# Patient Record
Sex: Female | Born: 1945
Health system: Southern US, Community
[De-identification: ages and names within clinical notes are randomized; demographics above are authoritative.]

## PROBLEM LIST (undated history)

## (undated) DIAGNOSIS — H6690 Otitis media, unspecified, unspecified ear: Secondary | ICD-10-CM

## (undated) DIAGNOSIS — K649 Unspecified hemorrhoids: Secondary | ICD-10-CM

## (undated) DIAGNOSIS — L0591 Pilonidal cyst without abscess: Secondary | ICD-10-CM

## (undated) DIAGNOSIS — N3020 Other chronic cystitis without hematuria: Secondary | ICD-10-CM

## (undated) DIAGNOSIS — I10 Essential (primary) hypertension: Secondary | ICD-10-CM

## (undated) HISTORY — DX: Unspecified hemorrhoids: K64.9

## (undated) HISTORY — DX: Otitis media, unspecified, unspecified ear: H66.90

## (undated) HISTORY — DX: Other chronic cystitis without hematuria: N30.20

## (undated) HISTORY — DX: Pilonidal cyst without abscess: L05.91

---

## 1980-08-13 HISTORY — PX: TUBAL LIGATION: SHX77

## 1998-12-30 ENCOUNTER — Other Ambulatory Visit: Admission: RE | Admit: 1998-12-30 | Discharge: 1998-12-30 | Payer: Self-pay | Admitting: Obstetrics and Gynecology

## 2001-04-03 ENCOUNTER — Other Ambulatory Visit: Admission: RE | Admit: 2001-04-03 | Discharge: 2001-04-03 | Payer: Self-pay | Admitting: Obstetrics and Gynecology

## 2001-11-28 ENCOUNTER — Emergency Department (HOSPITAL_COMMUNITY): Admission: EM | Admit: 2001-11-28 | Discharge: 2001-11-28 | Payer: Self-pay | Admitting: Emergency Medicine

## 2001-12-10 ENCOUNTER — Other Ambulatory Visit: Admission: RE | Admit: 2001-12-10 | Discharge: 2001-12-10 | Payer: Self-pay | Admitting: Obstetrics and Gynecology

## 2004-09-12 ENCOUNTER — Other Ambulatory Visit: Admission: RE | Admit: 2004-09-12 | Discharge: 2004-09-12 | Payer: Self-pay | Admitting: Obstetrics and Gynecology

## 2005-07-26 ENCOUNTER — Encounter: Admission: RE | Admit: 2005-07-26 | Discharge: 2005-07-26 | Payer: Self-pay | Admitting: Internal Medicine

## 2005-08-13 HISTORY — PX: COLONOSCOPY W/ BIOPSIES AND POLYPECTOMY: SHX1376

## 2006-05-07 ENCOUNTER — Ambulatory Visit: Payer: Self-pay | Admitting: Gastroenterology

## 2006-05-15 ENCOUNTER — Ambulatory Visit: Payer: Self-pay | Admitting: Gastroenterology

## 2011-02-05 ENCOUNTER — Other Ambulatory Visit: Payer: Self-pay | Admitting: Obstetrics and Gynecology

## 2011-02-05 DIAGNOSIS — R928 Other abnormal and inconclusive findings on diagnostic imaging of breast: Secondary | ICD-10-CM

## 2011-02-15 ENCOUNTER — Ambulatory Visit
Admission: RE | Admit: 2011-02-15 | Discharge: 2011-02-15 | Disposition: A | Discharge status: Home / Self Care | Payer: Medicare Other | Source: Ambulatory Visit | Attending: Obstetrics and Gynecology | Admitting: Obstetrics and Gynecology

## 2011-02-15 DIAGNOSIS — R928 Other abnormal and inconclusive findings on diagnostic imaging of breast: Secondary | ICD-10-CM

## 2011-02-15 IMAGING — MG MM DIGITAL DIAGNOSTIC UNILAT L {BCG}
2 series · 2 of 2 positions shown · non-contrast
Comparison: Prior exams, [REDACTED], dating back to
[DATE]

CLINICAL DATA: Screening callback for questioned left breast
calcifications in the lower inner quadrant

DIGITAL DIAGNOSTIC LEFT MAMMOGRAM WITHOUT CAD

[L CC]
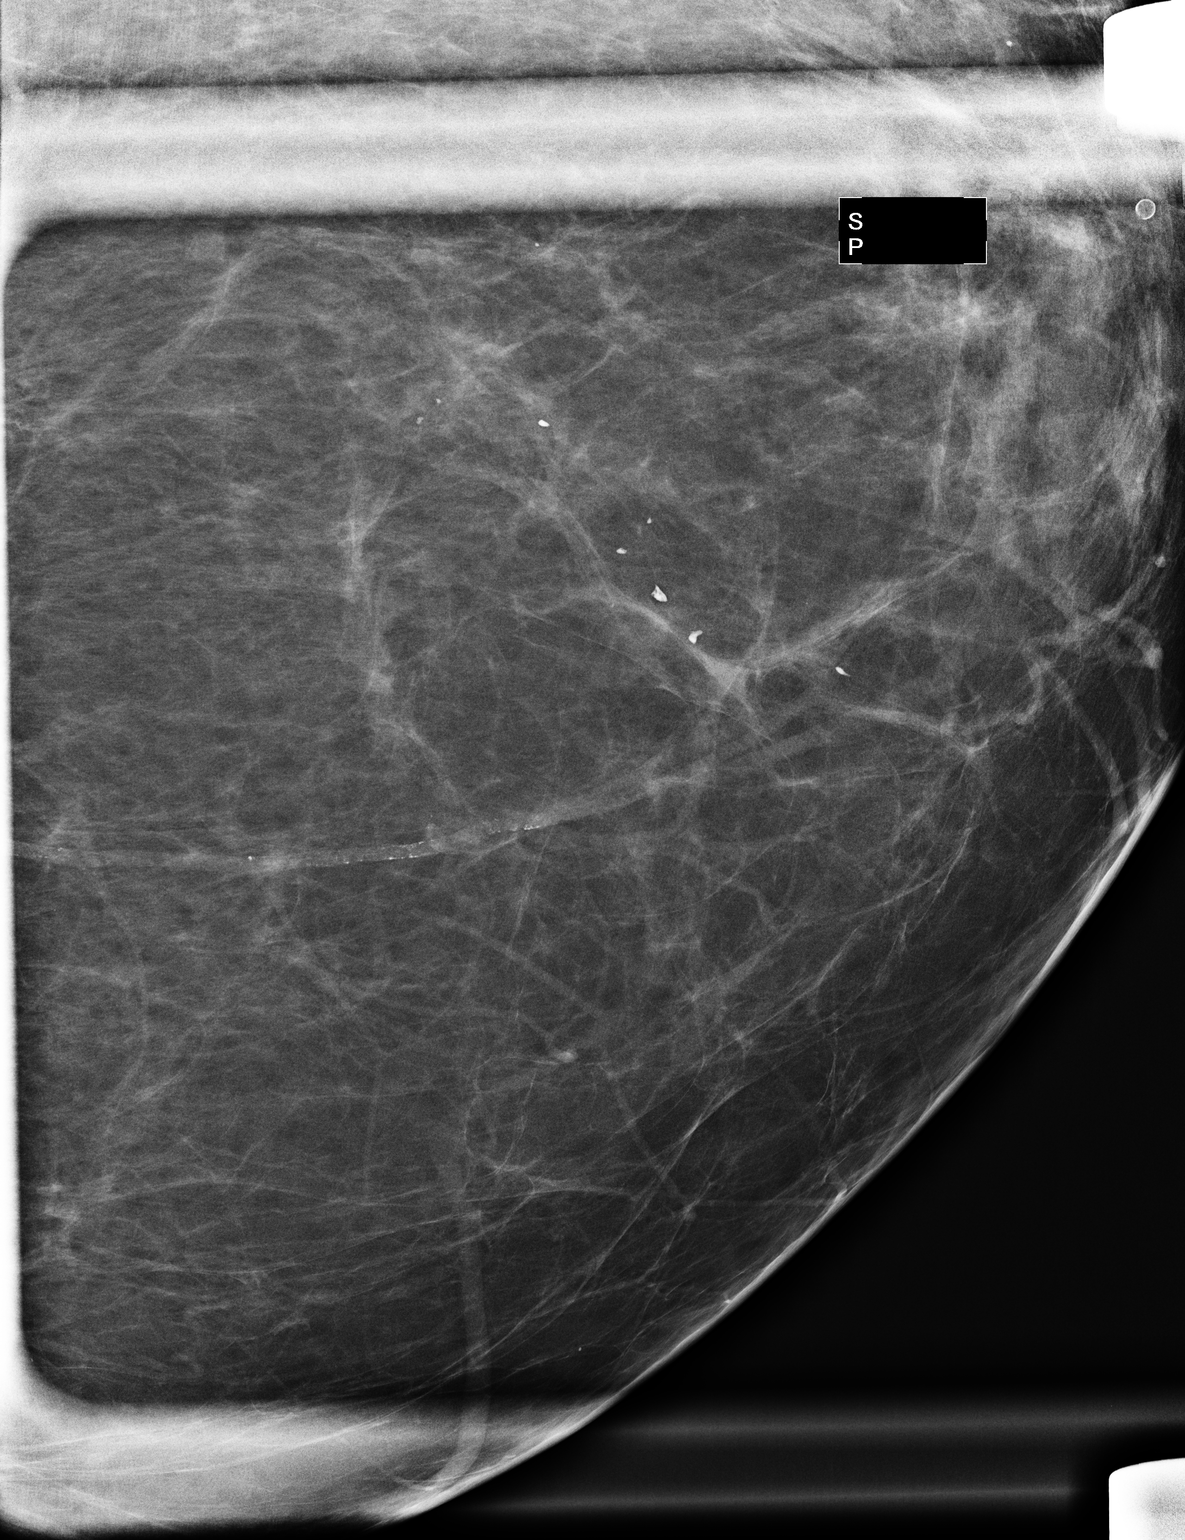

[L ML]
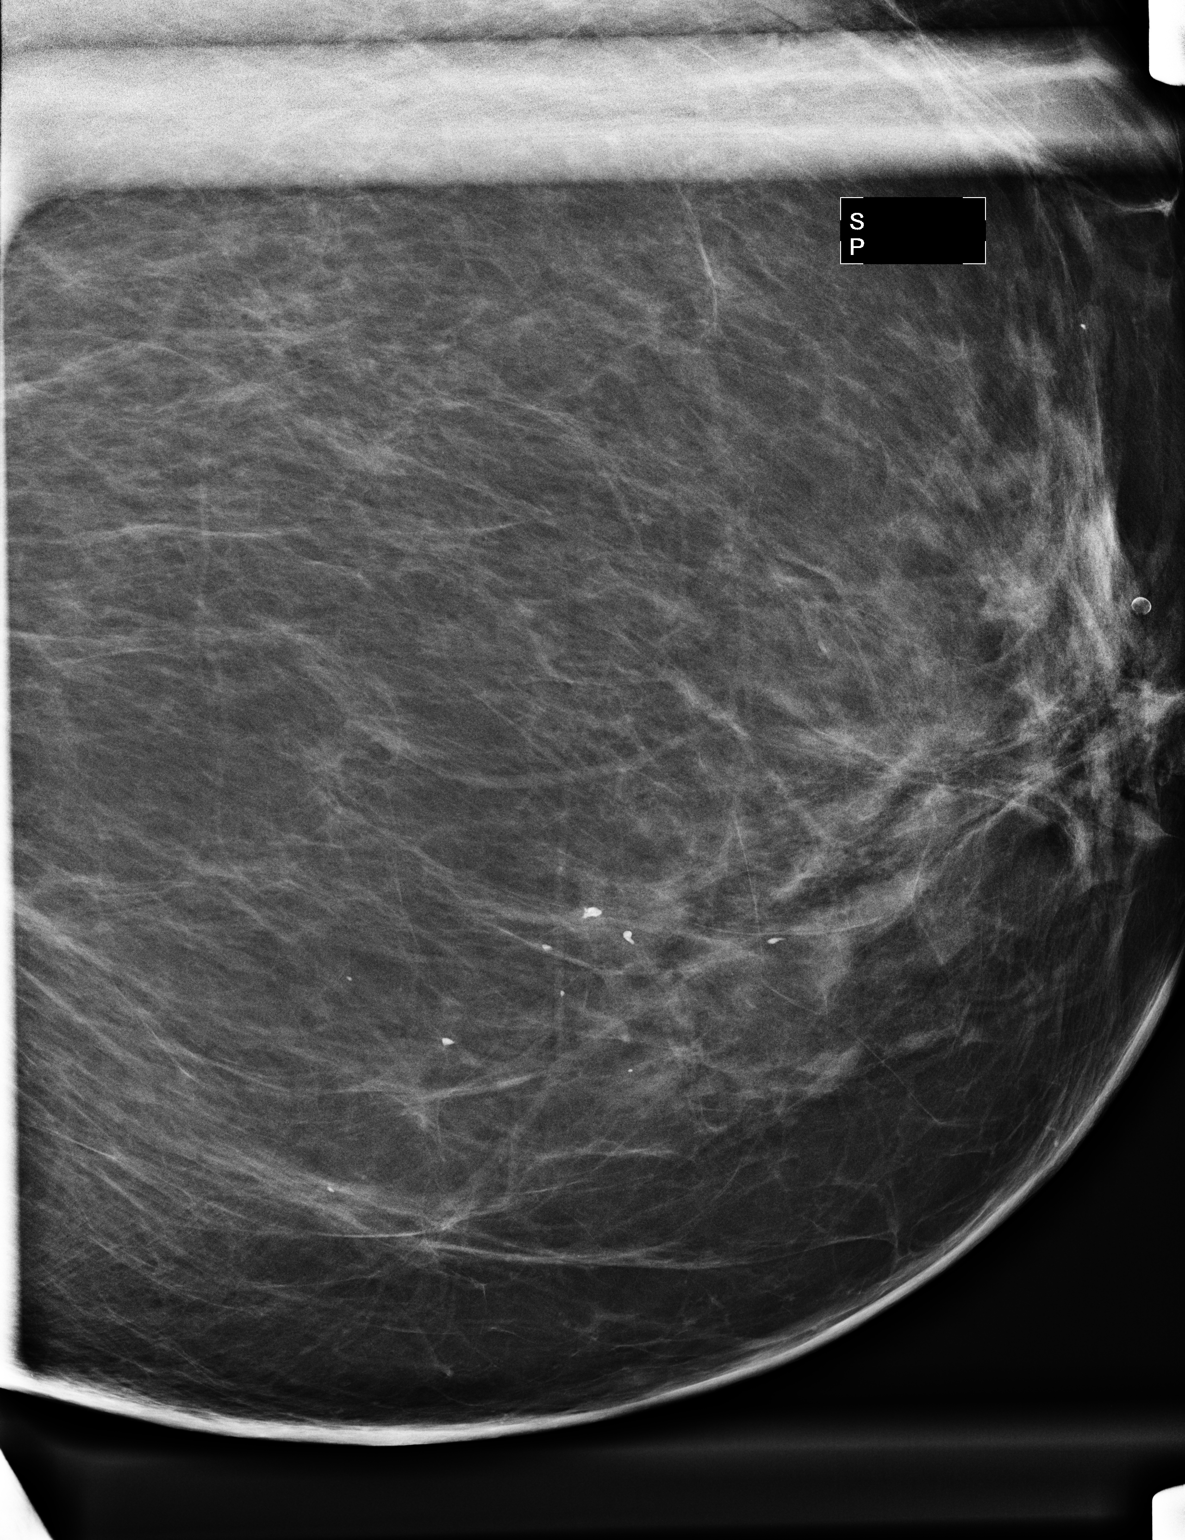

[2 of 2 positions shown; findings below may reference images not displayed]

FINDINGS: Benign appearing coarse calcifications are loosely
grouped in the left lower inner quadrant, without malignant-type
morphology or distribution.
IMPRESSION: Benign appearing calcifications, left lower inner quadrant.  No
evidence for malignancy. Screening mammography is recommended in
one year. Findings and recommendations discussed with the patient
and provided in written form at the time of the exam.

BI-RADS CATEGORY 1:  Negative.

## 2011-06-14 ENCOUNTER — Encounter: Payer: Self-pay | Admitting: Gastroenterology

## 2011-10-10 DIAGNOSIS — J019 Acute sinusitis, unspecified: Secondary | ICD-10-CM | POA: Diagnosis not present

## 2011-10-10 DIAGNOSIS — J209 Acute bronchitis, unspecified: Secondary | ICD-10-CM | POA: Diagnosis not present

## 2011-10-10 DIAGNOSIS — H6120 Impacted cerumen, unspecified ear: Secondary | ICD-10-CM | POA: Diagnosis not present

## 2011-11-23 DIAGNOSIS — R82998 Other abnormal findings in urine: Secondary | ICD-10-CM | POA: Diagnosis not present

## 2011-11-23 DIAGNOSIS — F329 Major depressive disorder, single episode, unspecified: Secondary | ICD-10-CM | POA: Diagnosis not present

## 2011-11-23 DIAGNOSIS — E669 Obesity, unspecified: Secondary | ICD-10-CM | POA: Diagnosis not present

## 2011-11-23 DIAGNOSIS — E785 Hyperlipidemia, unspecified: Secondary | ICD-10-CM | POA: Diagnosis not present

## 2011-11-30 DIAGNOSIS — F329 Major depressive disorder, single episode, unspecified: Secondary | ICD-10-CM | POA: Diagnosis not present

## 2011-11-30 DIAGNOSIS — E669 Obesity, unspecified: Secondary | ICD-10-CM | POA: Diagnosis not present

## 2011-11-30 DIAGNOSIS — E785 Hyperlipidemia, unspecified: Secondary | ICD-10-CM | POA: Diagnosis not present

## 2011-11-30 DIAGNOSIS — Z0000 Encounter for general adult medical examination without abnormal findings: Secondary | ICD-10-CM | POA: Diagnosis not present

## 2011-12-10 ENCOUNTER — Encounter: Payer: Self-pay | Admitting: Gastroenterology

## 2012-01-15 ENCOUNTER — Encounter: Payer: Self-pay | Admitting: Gastroenterology

## 2012-01-21 ENCOUNTER — Ambulatory Visit (AMBULATORY_SURGERY_CENTER): Payer: Medicare Other | Admitting: *Deleted

## 2012-01-21 VITALS — Ht 62.5 in | Wt 197.5 lb

## 2012-01-21 DIAGNOSIS — Z1211 Encounter for screening for malignant neoplasm of colon: Secondary | ICD-10-CM

## 2012-01-21 MED ORDER — PEG-KCL-NACL-NASULF-NA ASC-C 100 G PO SOLR: ORAL | Status: DC

## 2012-02-04 ENCOUNTER — Ambulatory Visit (AMBULATORY_SURGERY_CENTER): Payer: Medicare Other | Admitting: Gastroenterology

## 2012-02-04 ENCOUNTER — Encounter: Payer: Self-pay | Admitting: Gastroenterology

## 2012-02-04 VITALS — BP 144/75 | HR 84 | Temp 97.7°F | Resp 20 | Ht 62.5 in | Wt 197.0 lb

## 2012-02-04 DIAGNOSIS — Z860100 Personal history of colon polyps, unspecified: Secondary | ICD-10-CM

## 2012-02-04 DIAGNOSIS — D126 Benign neoplasm of colon, unspecified: Secondary | ICD-10-CM | POA: Diagnosis not present

## 2012-02-04 DIAGNOSIS — Z1211 Encounter for screening for malignant neoplasm of colon: Secondary | ICD-10-CM

## 2012-02-04 MED ORDER — SODIUM CHLORIDE 0.9 % IV SOLN: 500.0000 mL | INTRAVENOUS | Status: DC

## 2012-02-04 NOTE — Patient Instructions (Addendum)
YOU HAD AN ENDOSCOPIC PROCEDURE TODAY AT THE [facility]: Refer to the procedure report that was given to you for any specific questions about what was found during the examination.  If the procedure report does not answer your questions, please call your gastroenterologist to clarify.  If you requested that your care partner not be given the details of your procedure findings, then the procedure report has been included in a sealed envelope for you to review at your convenience later.  YOU SHOULD EXPECT: Some feelings of bloating in the abdomen. Passage of more gas than usual.  Walking can help get rid of the air that was put into your GI tract during the procedure and reduce the bloating. If you had a lower endoscopy (such as a colonoscopy or flexible sigmoidoscopy) you may notice spotting of blood in your stool or on the toilet paper. If you underwent a bowel prep for your procedure, then you may not have a normal bowel movement for a few days.  DIET: Your first meal following the procedure should be a light meal and then it is ok to progress to your normal diet.  A half-sandwich or bowl of soup is an example of a good first meal.  Heavy or fried foods are harder to digest and may make you feel nauseous or bloated.  Likewise meals heavy in dairy and vegetables can cause extra gas to form and this can also increase the bloating.  Drink plenty of fluids but you should avoid alcoholic beverages for 24 hours.  ACTIVITY: Your care partner should take you home directly after the procedure.  You should plan to take it easy, moving slowly for the rest of the day.  You can resume normal activity the day after the procedure however you should NOT DRIVE or use heavy machinery for 24 hours (because of the sedation medicines used during the test).    SYMPTOMS TO REPORT IMMEDIATELY: A gastroenterologist can be reached at any hour.  During normal business hours, 8:30 AM to 5:00 PM Monday through Friday,  call [phone].  After hours and on weekends, please call the GI answering service at [phone] who will take a message and have the physician on call contact you.   Following lower endoscopy (colonoscopy or flexible sigmoidoscopy):  Excessive amounts of blood in the stool  Significant tenderness or worsening of abdominal pains  Swelling of the abdomen that is new, acute  Fever of 100F or higher  Following upper endoscopy (EGD)  Vomiting of blood or coffee ground material  New chest pain or pain under the shoulder blades  Painful or persistently difficult swallowing  New shortness of breath  Fever of 100F or higher  Black, tarry-looking stools  FOLLOW UP: If any biopsies were taken you will be contacted by phone or by letter within the next 1-3 weeks.  Call your gastroenterologist if you have not heard about the biopsies in 3 weeks.  Our staff will call the home number listed on your records the next business day following your procedure to check on you and address any questions or concerns that you may have at that time regarding the information given to you following your procedure. This is a courtesy call and so if there is no answer at the home number and we have not heard from you through the emergency physician on call, we will assume that you have returned to your regular daily activities without incident.  SIGNATURES/CONFIDENTIALITY: You and/or your care   partner have signed paperwork which will be entered into your electronic medical record.  These signatures attest to the fact that that the information above on your After Visit Summary has been reviewed and is understood.  Full responsibility of the confidentiality of this discharge information lies with you and/or your care-partner.  

## 2012-02-04 NOTE — Progress Notes (Signed)
Propofol given per K Rogers CRNA 

## 2012-02-04 NOTE — Progress Notes (Signed)
Patient did not experience any of the following events: a burn prior to discharge; a fall within the facility; wrong site/side/patient/procedure/implant event; or a hospital transfer or hospital admission upon discharge from the facility. (G8907) Patient did not have preoperative order for IV antibiotic SSI prophylaxis. (G8918)  

## 2012-02-04 NOTE — Op Note (Signed)
[facility] [address] [place], [place]  [address]  COLONOSCOPY PROCEDURE REPORT  PATIENT:  Olivia Velazquez, Olivia Velazquez  MR#:  [id number] BIRTHDATE:  03-07-1946, 65 yrs. old  GENDER:  female ENDOSCOPIST:  Barbette Hair. Arlyce Dice, MD REF. BY: PROCEDURE DATE:  02/04/2012 PROCEDURE:  Colonoscopy with snare polypectomy, Colon with cold biopsy polypectomy ASA CLASS:  Class I INDICATIONS:  Screening, history of pre-cancerous (adenomatous) colon polyps Index polypectomy 2007 MEDICATIONS:   MAC sedation, administered by CRNA propofol 200mg IV  DESCRIPTION OF PROCEDURE:   After the risks benefits and alternatives of the procedure were thoroughly explained, informed consent was obtained.  Digital rectal exam was performed and revealed no abnormalities.   The LB [id number]-[id number] endoscope was introduced through the anus and advanced to the cecum, which was identified by both the appendix and ileocecal valve, without limitations.  The quality of the prep was good, using MoviPrep. The instrument was then slowly withdrawn as the colon was fully examined. <<PROCEDUREIMAGES>>  FINDINGS:  A sessile polyp was found in the mid transverse colon. It was 5 mm in size. Polyp was snared without cautery. Retrieval was successful (see image3). snare polyp  A diminutive polyp was found in the distal transverse colon. It was 2 mm in size. The polyp was removed using cold biopsy forceps (see image4).  A lipoma was found in the proximal transverse colon (see image2). This was otherwise a normal examination of the colon (see image1 and image5).   Retroflexed views in the rectum revealed no abnormalities.    The time to cecum =  1) 4.0  minutes. The scope was then withdrawn in  1) 9.0  minutes from the cecum and the procedure completed. COMPLICATIONS:  None ENDOSCOPIC IMPRESSION: 1) 5 mm sessile polyp in the mid transverse colon 2) 2 mm diminutive polyp in the distal transverse colon 3) Lipoma in the proximal  transverse colon 4) Otherwise normal examination RECOMMENDATIONS: 1) If the polyp(s) removed today are proven to be adenomatous (pre-cancerous) polyps, you will need a repeat colonoscopy in 5 years. Otherwise you should continue to follow colorectal cancer screening guidelines for "routine risk" patients with colonoscopy in 10 years. You will receive a letter within 1-2 weeks with the results of your biopsy as well as final recommendations. Please call my office if you have not received a letter after 3 weeks. REPEAT EXAM:  You will receive a letter from Dr. Arlyce Dice in 1-2 weeks, after reviewing the final pathology, with followup recommendations.  ______________________________ Barbette Hair Arlyce Dice, MD  CC:  Kari Baars, MD  n. eSIGNED:   Barbette Hair. Ryler Laskowski at 02/04/2012 09:46 AM  Elgie Collard, [id number]

## 2012-02-05 ENCOUNTER — Telehealth: Payer: Self-pay

## 2012-02-05 NOTE — Telephone Encounter (Signed)
  Follow up Call-  Call back number 02/04/2012  Post procedure Call Back phone  # [phone]  Permission to leave phone message Yes     Patient questions:  Do you have a fever, pain , or abdominal swelling? no Pain Score  0 *  Have you tolerated food without any problems? yes  Have you been able to return to your normal activities? yes  Do you have any questions about your discharge instructions: Diet   no Medications  no Follow up visit  no  Do you have questions or concerns about your Care? no  Actions: * If pain score is 4 or above: No action needed, pain <4.

## 2012-02-07 ENCOUNTER — Encounter: Payer: Self-pay | Admitting: Gastroenterology

## 2012-04-29 DIAGNOSIS — Z23 Encounter for immunization: Secondary | ICD-10-CM | POA: Diagnosis not present

## 2012-07-04 DIAGNOSIS — Z23 Encounter for immunization: Secondary | ICD-10-CM | POA: Diagnosis not present

## 2012-07-07 DIAGNOSIS — Z83511 Family history of glaucoma: Secondary | ICD-10-CM | POA: Diagnosis not present

## 2012-07-07 DIAGNOSIS — H5200 Hypermetropia, unspecified eye: Secondary | ICD-10-CM | POA: Diagnosis not present

## 2012-07-07 DIAGNOSIS — H2510 Age-related nuclear cataract, unspecified eye: Secondary | ICD-10-CM | POA: Diagnosis not present

## 2012-07-07 DIAGNOSIS — D3130 Benign neoplasm of unspecified choroid: Secondary | ICD-10-CM | POA: Diagnosis not present

## 2012-11-26 DIAGNOSIS — E785 Hyperlipidemia, unspecified: Secondary | ICD-10-CM | POA: Diagnosis not present

## 2012-12-03 DIAGNOSIS — Z0000 Encounter for general adult medical examination without abnormal findings: Secondary | ICD-10-CM | POA: Diagnosis not present

## 2012-12-03 DIAGNOSIS — K6289 Other specified diseases of anus and rectum: Secondary | ICD-10-CM | POA: Diagnosis not present

## 2012-12-03 DIAGNOSIS — E669 Obesity, unspecified: Secondary | ICD-10-CM | POA: Diagnosis not present

## 2012-12-03 DIAGNOSIS — E785 Hyperlipidemia, unspecified: Secondary | ICD-10-CM | POA: Diagnosis not present

## 2012-12-03 DIAGNOSIS — F329 Major depressive disorder, single episode, unspecified: Secondary | ICD-10-CM | POA: Diagnosis not present

## 2012-12-03 DIAGNOSIS — Z6835 Body mass index (BMI) 35.0-35.9, adult: Secondary | ICD-10-CM | POA: Diagnosis not present

## 2012-12-03 DIAGNOSIS — Z1331 Encounter for screening for depression: Secondary | ICD-10-CM | POA: Diagnosis not present

## 2012-12-03 DIAGNOSIS — J45909 Unspecified asthma, uncomplicated: Secondary | ICD-10-CM | POA: Diagnosis not present

## 2012-12-03 DIAGNOSIS — N390 Urinary tract infection, site not specified: Secondary | ICD-10-CM | POA: Diagnosis not present

## 2013-04-30 ENCOUNTER — Ambulatory Visit (INDEPENDENT_AMBULATORY_CARE_PROVIDER_SITE_OTHER): Payer: Self-pay | Admitting: Gastroenterology

## 2013-04-30 ENCOUNTER — Encounter: Payer: Self-pay | Admitting: Gastroenterology

## 2013-04-30 VITALS — BP 120/78 | HR 80 | Ht 62.5 in | Wt 196.5 lb

## 2013-04-30 DIAGNOSIS — K649 Unspecified hemorrhoids: Secondary | ICD-10-CM

## 2013-04-30 MED ORDER — HYDROCORTISONE 2.5 % RE CREA: TOPICAL_CREAM | Freq: Two times a day (BID) | RECTAL | Status: DC

## 2013-04-30 NOTE — Assessment & Plan Note (Signed)
Intermittent rectal discomfort is undoubtedly related to hemorrhoids.  She has responded well to topical ointments in the past.  Constipation is probably a contributing factor.  Recommendations #1 renew Procto cream #2 continue stool softeners #3 to consider band ligation of internal hemorrhoids if symptoms are persistent

## 2013-04-30 NOTE — Progress Notes (Signed)
History of Present Illness: 67 year old white female with history of colon polyps here for evaluation of rectal discomfort.  Intermittently she has rectal pain for which she takes a topical cream with relief.  She notices "bumps" She suffers from chronic constipation which recently has improved with the addition of stool softeners.    Past Medical History  Diagnosis Date  . Bladder infection, chronic    Past Surgical History  Procedure Laterality Date  . Tubal ligation  1982  . Colonoscopy w/ biopsies and polypectomy  2007    adenomatous polps/ Dr. Arlyce Dice   family history includes Colon cancer (age of onset: 70) in her paternal uncle. There is no history of Stomach cancer. Current Outpatient Prescriptions  Medication Sig Dispense Refill  . hydrocortisone (PROCTOZONE-HC) 2.5 % rectal cream Place rectally 2 (two) times daily.      Marland Kitchen OVER THE COUNTER MEDICATION [employer] brand stool softener      . nitrofurantoin (MACRODANTIN) 100 MG capsule Take 100 mg by mouth daily.       No current facility-administered medications for this visit.   Allergies as of 04/30/2013  . (No Known Allergies)    reports that she has never smoked. She has never used smokeless tobacco. She reports that she does not drink alcohol or use illicit drugs.     Review of Systems: Pertinent positive and negative review of systems were noted in the above HPI section. All other review of systems were otherwise negative.  Vital signs were reviewed in today's medical record Physical Exam: General: Well developed , well nourished, no acute distress Skin: anicteric Head: Normocephalic and atraumatic Eyes:  sclerae anicteric, EOMI Ears: Normal auditory acuity Mouth: No deformity or lesions Neck: Supple, no masses or thyromegaly Lungs: Clear throughout to auscultation Heart: Regular rate and rhythm; no murmurs, rubs or bruits Abdomen: Soft, non tender and non distended. No masses, hepatosplenomegaly or hernias noted.  Normal Bowel sounds Rectal: External skin tags are present Musculoskeletal: Symmetrical with no gross deformities  Skin: No lesions on visible extremities Pulses:  Normal pulses noted Extremities: No clubbing, cyanosis, edema or deformities noted Neurological: Alert oriented x 4, grossly nonfocal Cervical Nodes:  No significant cervical adenopathy Inguinal Nodes: No significant inguinal adenopathy Psychological:  Alert and cooperative. Normal mood and affect

## 2013-04-30 NOTE — Patient Instructions (Addendum)
Your prescription will be sent to your pharmacy 

## 2013-05-08 DIAGNOSIS — Z23 Encounter for immunization: Secondary | ICD-10-CM | POA: Diagnosis not present

## 2013-07-14 DIAGNOSIS — H2510 Age-related nuclear cataract, unspecified eye: Secondary | ICD-10-CM | POA: Diagnosis not present

## 2013-07-14 DIAGNOSIS — D3130 Benign neoplasm of unspecified choroid: Secondary | ICD-10-CM | POA: Diagnosis not present

## 2013-12-01 DIAGNOSIS — R82998 Other abnormal findings in urine: Secondary | ICD-10-CM | POA: Diagnosis not present

## 2013-12-01 DIAGNOSIS — E785 Hyperlipidemia, unspecified: Secondary | ICD-10-CM | POA: Diagnosis not present

## 2013-12-08 DIAGNOSIS — E785 Hyperlipidemia, unspecified: Secondary | ICD-10-CM | POA: Diagnosis not present

## 2013-12-08 DIAGNOSIS — Z0000 Encounter for general adult medical examination without abnormal findings: Secondary | ICD-10-CM | POA: Diagnosis not present

## 2013-12-08 DIAGNOSIS — F329 Major depressive disorder, single episode, unspecified: Secondary | ICD-10-CM | POA: Diagnosis not present

## 2013-12-08 DIAGNOSIS — K6289 Other specified diseases of anus and rectum: Secondary | ICD-10-CM | POA: Diagnosis not present

## 2013-12-08 DIAGNOSIS — E669 Obesity, unspecified: Secondary | ICD-10-CM | POA: Diagnosis not present

## 2013-12-08 DIAGNOSIS — Z23 Encounter for immunization: Secondary | ICD-10-CM | POA: Diagnosis not present

## 2013-12-08 DIAGNOSIS — F3289 Other specified depressive episodes: Secondary | ICD-10-CM | POA: Diagnosis not present

## 2013-12-08 DIAGNOSIS — N390 Urinary tract infection, site not specified: Secondary | ICD-10-CM | POA: Diagnosis not present

## 2013-12-08 DIAGNOSIS — J45909 Unspecified asthma, uncomplicated: Secondary | ICD-10-CM | POA: Diagnosis not present

## 2014-03-18 ENCOUNTER — Other Ambulatory Visit: Payer: Self-pay | Admitting: Gastroenterology

## 2014-05-07 DIAGNOSIS — Z23 Encounter for immunization: Secondary | ICD-10-CM | POA: Diagnosis not present

## 2014-06-07 ENCOUNTER — Other Ambulatory Visit: Payer: Self-pay | Admitting: Gastroenterology

## 2014-07-20 DIAGNOSIS — H52223 Regular astigmatism, bilateral: Secondary | ICD-10-CM | POA: Diagnosis not present

## 2014-07-20 DIAGNOSIS — H2513 Age-related nuclear cataract, bilateral: Secondary | ICD-10-CM | POA: Diagnosis not present

## 2014-07-20 DIAGNOSIS — D3132 Benign neoplasm of left choroid: Secondary | ICD-10-CM | POA: Diagnosis not present

## 2014-07-20 DIAGNOSIS — H5203 Hypermetropia, bilateral: Secondary | ICD-10-CM | POA: Diagnosis not present

## 2014-07-22 DIAGNOSIS — H6122 Impacted cerumen, left ear: Secondary | ICD-10-CM | POA: Diagnosis not present

## 2014-07-22 DIAGNOSIS — H6121 Impacted cerumen, right ear: Secondary | ICD-10-CM | POA: Diagnosis not present

## 2014-10-07 DIAGNOSIS — J069 Acute upper respiratory infection, unspecified: Secondary | ICD-10-CM | POA: Diagnosis not present

## 2014-10-07 DIAGNOSIS — R0602 Shortness of breath: Secondary | ICD-10-CM | POA: Diagnosis not present

## 2014-10-07 DIAGNOSIS — Z6838 Body mass index (BMI) 38.0-38.9, adult: Secondary | ICD-10-CM | POA: Diagnosis not present

## 2014-10-07 DIAGNOSIS — J45909 Unspecified asthma, uncomplicated: Secondary | ICD-10-CM | POA: Diagnosis not present

## 2014-12-09 DIAGNOSIS — E785 Hyperlipidemia, unspecified: Secondary | ICD-10-CM | POA: Diagnosis not present

## 2014-12-09 DIAGNOSIS — Z0000 Encounter for general adult medical examination without abnormal findings: Secondary | ICD-10-CM | POA: Diagnosis not present

## 2014-12-16 DIAGNOSIS — F329 Major depressive disorder, single episode, unspecified: Secondary | ICD-10-CM | POA: Diagnosis not present

## 2014-12-16 DIAGNOSIS — Z0000 Encounter for general adult medical examination without abnormal findings: Secondary | ICD-10-CM | POA: Diagnosis not present

## 2014-12-16 DIAGNOSIS — E785 Hyperlipidemia, unspecified: Secondary | ICD-10-CM | POA: Diagnosis not present

## 2014-12-16 DIAGNOSIS — R312 Other microscopic hematuria: Secondary | ICD-10-CM | POA: Diagnosis not present

## 2014-12-16 DIAGNOSIS — Z6838 Body mass index (BMI) 38.0-38.9, adult: Secondary | ICD-10-CM | POA: Diagnosis not present

## 2014-12-16 DIAGNOSIS — J45909 Unspecified asthma, uncomplicated: Secondary | ICD-10-CM | POA: Diagnosis not present

## 2014-12-16 DIAGNOSIS — E669 Obesity, unspecified: Secondary | ICD-10-CM | POA: Diagnosis not present

## 2014-12-16 DIAGNOSIS — N390 Urinary tract infection, site not specified: Secondary | ICD-10-CM | POA: Diagnosis not present

## 2014-12-16 DIAGNOSIS — Z1389 Encounter for screening for other disorder: Secondary | ICD-10-CM | POA: Diagnosis not present

## 2014-12-22 DIAGNOSIS — Z780 Asymptomatic menopausal state: Secondary | ICD-10-CM | POA: Diagnosis not present

## 2015-06-01 DIAGNOSIS — Z23 Encounter for immunization: Secondary | ICD-10-CM | POA: Diagnosis not present

## 2015-07-26 DIAGNOSIS — H5203 Hypermetropia, bilateral: Secondary | ICD-10-CM | POA: Diagnosis not present

## 2015-07-26 DIAGNOSIS — H2513 Age-related nuclear cataract, bilateral: Secondary | ICD-10-CM | POA: Diagnosis not present

## 2015-07-26 DIAGNOSIS — H25013 Cortical age-related cataract, bilateral: Secondary | ICD-10-CM | POA: Diagnosis not present

## 2015-07-26 DIAGNOSIS — D3132 Benign neoplasm of left choroid: Secondary | ICD-10-CM | POA: Diagnosis not present

## 2015-09-05 DIAGNOSIS — N390 Urinary tract infection, site not specified: Secondary | ICD-10-CM | POA: Diagnosis not present

## 2015-09-14 DIAGNOSIS — R7301 Impaired fasting glucose: Secondary | ICD-10-CM | POA: Diagnosis not present

## 2015-09-14 DIAGNOSIS — Z6838 Body mass index (BMI) 38.0-38.9, adult: Secondary | ICD-10-CM | POA: Diagnosis not present

## 2015-09-14 DIAGNOSIS — Z1389 Encounter for screening for other disorder: Secondary | ICD-10-CM | POA: Diagnosis not present

## 2015-09-14 DIAGNOSIS — N390 Urinary tract infection, site not specified: Secondary | ICD-10-CM | POA: Diagnosis not present

## 2015-12-14 DIAGNOSIS — Z79899 Other long term (current) drug therapy: Secondary | ICD-10-CM | POA: Diagnosis not present

## 2015-12-14 DIAGNOSIS — E784 Other hyperlipidemia: Secondary | ICD-10-CM | POA: Diagnosis not present

## 2015-12-14 DIAGNOSIS — M859 Disorder of bone density and structure, unspecified: Secondary | ICD-10-CM | POA: Diagnosis not present

## 2015-12-14 DIAGNOSIS — R7301 Impaired fasting glucose: Secondary | ICD-10-CM | POA: Diagnosis not present

## 2015-12-21 DIAGNOSIS — F329 Major depressive disorder, single episode, unspecified: Secondary | ICD-10-CM | POA: Diagnosis not present

## 2015-12-21 DIAGNOSIS — M859 Disorder of bone density and structure, unspecified: Secondary | ICD-10-CM | POA: Diagnosis not present

## 2015-12-21 DIAGNOSIS — E784 Other hyperlipidemia: Secondary | ICD-10-CM | POA: Diagnosis not present

## 2015-12-21 DIAGNOSIS — Z6836 Body mass index (BMI) 36.0-36.9, adult: Secondary | ICD-10-CM | POA: Diagnosis not present

## 2015-12-21 DIAGNOSIS — E668 Other obesity: Secondary | ICD-10-CM | POA: Diagnosis not present

## 2015-12-21 DIAGNOSIS — J45998 Other asthma: Secondary | ICD-10-CM | POA: Diagnosis not present

## 2015-12-21 DIAGNOSIS — Z0000 Encounter for general adult medical examination without abnormal findings: Secondary | ICD-10-CM | POA: Diagnosis not present

## 2015-12-21 DIAGNOSIS — E559 Vitamin D deficiency, unspecified: Secondary | ICD-10-CM | POA: Diagnosis not present

## 2015-12-21 DIAGNOSIS — R7301 Impaired fasting glucose: Secondary | ICD-10-CM | POA: Diagnosis not present

## 2015-12-21 DIAGNOSIS — Z1389 Encounter for screening for other disorder: Secondary | ICD-10-CM | POA: Diagnosis not present

## 2015-12-21 DIAGNOSIS — N390 Urinary tract infection, site not specified: Secondary | ICD-10-CM | POA: Diagnosis not present

## 2016-05-26 DIAGNOSIS — Z23 Encounter for immunization: Secondary | ICD-10-CM | POA: Diagnosis not present

## 2016-07-27 DIAGNOSIS — D3132 Benign neoplasm of left choroid: Secondary | ICD-10-CM | POA: Diagnosis not present

## 2016-07-27 DIAGNOSIS — Z83511 Family history of glaucoma: Secondary | ICD-10-CM | POA: Diagnosis not present

## 2016-07-27 DIAGNOSIS — H25013 Cortical age-related cataract, bilateral: Secondary | ICD-10-CM | POA: Diagnosis not present

## 2016-07-27 DIAGNOSIS — H524 Presbyopia: Secondary | ICD-10-CM | POA: Diagnosis not present

## 2016-07-27 DIAGNOSIS — H5203 Hypermetropia, bilateral: Secondary | ICD-10-CM | POA: Diagnosis not present

## 2016-07-27 DIAGNOSIS — H52203 Unspecified astigmatism, bilateral: Secondary | ICD-10-CM | POA: Diagnosis not present

## 2016-07-27 DIAGNOSIS — H2513 Age-related nuclear cataract, bilateral: Secondary | ICD-10-CM | POA: Diagnosis not present

## 2016-11-27 ENCOUNTER — Encounter: Payer: Self-pay | Admitting: Gastroenterology

## 2016-12-14 DIAGNOSIS — E784 Other hyperlipidemia: Secondary | ICD-10-CM | POA: Diagnosis not present

## 2016-12-14 DIAGNOSIS — M859 Disorder of bone density and structure, unspecified: Secondary | ICD-10-CM | POA: Diagnosis not present

## 2016-12-14 DIAGNOSIS — R7301 Impaired fasting glucose: Secondary | ICD-10-CM | POA: Diagnosis not present

## 2016-12-21 DIAGNOSIS — N390 Urinary tract infection, site not specified: Secondary | ICD-10-CM | POA: Diagnosis not present

## 2016-12-21 DIAGNOSIS — J45909 Unspecified asthma, uncomplicated: Secondary | ICD-10-CM | POA: Diagnosis not present

## 2016-12-21 DIAGNOSIS — Z0000 Encounter for general adult medical examination without abnormal findings: Secondary | ICD-10-CM | POA: Diagnosis not present

## 2016-12-21 DIAGNOSIS — Z6835 Body mass index (BMI) 35.0-35.9, adult: Secondary | ICD-10-CM | POA: Diagnosis not present

## 2016-12-21 DIAGNOSIS — E668 Other obesity: Secondary | ICD-10-CM | POA: Diagnosis not present

## 2016-12-21 DIAGNOSIS — R7301 Impaired fasting glucose: Secondary | ICD-10-CM | POA: Diagnosis not present

## 2016-12-21 DIAGNOSIS — E559 Vitamin D deficiency, unspecified: Secondary | ICD-10-CM | POA: Diagnosis not present

## 2016-12-21 DIAGNOSIS — M859 Disorder of bone density and structure, unspecified: Secondary | ICD-10-CM | POA: Diagnosis not present

## 2016-12-21 DIAGNOSIS — F3289 Other specified depressive episodes: Secondary | ICD-10-CM | POA: Diagnosis not present

## 2016-12-21 DIAGNOSIS — Z1389 Encounter for screening for other disorder: Secondary | ICD-10-CM | POA: Diagnosis not present

## 2016-12-21 DIAGNOSIS — E784 Other hyperlipidemia: Secondary | ICD-10-CM | POA: Diagnosis not present

## 2017-05-11 DIAGNOSIS — Z23 Encounter for immunization: Secondary | ICD-10-CM | POA: Diagnosis not present

## 2017-05-16 ENCOUNTER — Encounter: Payer: Self-pay | Admitting: Gastroenterology

## 2017-06-17 DIAGNOSIS — R8290 Unspecified abnormal findings in urine: Secondary | ICD-10-CM | POA: Diagnosis not present

## 2017-06-17 DIAGNOSIS — N390 Urinary tract infection, site not specified: Secondary | ICD-10-CM | POA: Diagnosis not present

## 2017-06-25 DIAGNOSIS — H6063 Unspecified chronic otitis externa, bilateral: Secondary | ICD-10-CM | POA: Diagnosis not present

## 2017-06-25 DIAGNOSIS — H6122 Impacted cerumen, left ear: Secondary | ICD-10-CM | POA: Diagnosis not present

## 2017-06-27 ENCOUNTER — Ambulatory Visit (AMBULATORY_SURGERY_CENTER): Payer: Self-pay

## 2017-06-27 VITALS — Ht 62.2 in | Wt 200.0 lb

## 2017-06-27 DIAGNOSIS — R8290 Unspecified abnormal findings in urine: Secondary | ICD-10-CM | POA: Diagnosis not present

## 2017-06-27 DIAGNOSIS — R3129 Other microscopic hematuria: Secondary | ICD-10-CM | POA: Diagnosis not present

## 2017-06-27 DIAGNOSIS — R82998 Other abnormal findings in urine: Secondary | ICD-10-CM | POA: Diagnosis not present

## 2017-06-27 DIAGNOSIS — Z860100 Personal history of colon polyps, unspecified: Secondary | ICD-10-CM

## 2017-06-27 DIAGNOSIS — B373 Candidiasis of vulva and vagina: Secondary | ICD-10-CM | POA: Diagnosis not present

## 2017-06-27 DIAGNOSIS — Z6835 Body mass index (BMI) 35.0-35.9, adult: Secondary | ICD-10-CM | POA: Diagnosis not present

## 2017-06-27 DIAGNOSIS — N390 Urinary tract infection, site not specified: Secondary | ICD-10-CM | POA: Diagnosis not present

## 2017-06-27 MED ORDER — SUPREP BOWEL PREP KIT 17.5-3.13-1.6 GM/177ML PO SOLN: 1.0000 | Freq: Once | ORAL | 0 refills | Status: AC

## 2017-06-27 NOTE — Progress Notes (Signed)
No allergies to eggs or soy No home oxygen No diet meds No past problems with anesthesia  Declined emmi  

## 2017-07-11 ENCOUNTER — Ambulatory Visit (AMBULATORY_SURGERY_CENTER): Payer: Medicare Other | Admitting: Gastroenterology

## 2017-07-11 ENCOUNTER — Encounter: Payer: Self-pay | Admitting: Gastroenterology

## 2017-07-11 VITALS — BP 113/76 | HR 71 | Temp 96.8°F | Resp 11 | Ht 62.0 in | Wt 200.0 lb

## 2017-07-11 DIAGNOSIS — D128 Benign neoplasm of rectum: Secondary | ICD-10-CM | POA: Diagnosis not present

## 2017-07-11 DIAGNOSIS — D123 Benign neoplasm of transverse colon: Secondary | ICD-10-CM | POA: Diagnosis not present

## 2017-07-11 DIAGNOSIS — K635 Polyp of colon: Secondary | ICD-10-CM | POA: Diagnosis not present

## 2017-07-11 DIAGNOSIS — D129 Benign neoplasm of anus and anal canal: Secondary | ICD-10-CM

## 2017-07-11 DIAGNOSIS — Z86010 Personal history of colonic polyps: Secondary | ICD-10-CM

## 2017-07-11 DIAGNOSIS — D122 Benign neoplasm of ascending colon: Secondary | ICD-10-CM | POA: Diagnosis not present

## 2017-07-11 MED ORDER — SODIUM CHLORIDE 0.9 % IV SOLN: 500.0000 mL | INTRAVENOUS | Status: DC

## 2017-07-11 NOTE — Progress Notes (Signed)
Tp PACU pt awake and alert. Report to RN

## 2017-07-11 NOTE — Progress Notes (Signed)
Called to room to assist during endoscopic procedure.  Patient ID and intended procedure confirmed with present staff. Received instructions for my participation in the procedure from the performing physician.  

## 2017-07-11 NOTE — Patient Instructions (Signed)
Discharge instructions given. Handouts on polyps and diverticulosis. Resume previous medications. YOU HAD AN ENDOSCOPIC PROCEDURE TODAY AT THE [facility]:   Refer to the procedure report that was given to you for any specific questions about what was found during the examination.  If the procedure report does not answer your questions, please call your gastroenterologist to clarify.  If you requested that your care partner not be given the details of your procedure findings, then the procedure report has been included in a sealed envelope for you to review at your convenience later.  YOU SHOULD EXPECT: Some feelings of bloating in the abdomen. Passage of more gas than usual.  Walking can help get rid of the air that was put into your GI tract during the procedure and reduce the bloating. If you had a lower endoscopy (such as a colonoscopy or flexible sigmoidoscopy) you may notice spotting of blood in your stool or on the toilet paper. If you underwent a bowel prep for your procedure, you may not have a normal bowel movement for a few days.  Please Note:  You might notice some irritation and congestion in your nose or some drainage.  This is from the oxygen used during your procedure.  There is no need for concern and it should clear up in a day or so.  SYMPTOMS TO REPORT IMMEDIATELY:   Following lower endoscopy (colonoscopy or flexible sigmoidoscopy):  Excessive amounts of blood in the stool  Significant tenderness or worsening of abdominal pains  Swelling of the abdomen that is new, acute  Fever of 100F or higher   For urgent or emergent issues, a gastroenterologist can be reached at any hour by calling [phone].   DIET:  We do recommend a small meal at first, but then you may proceed to your regular diet.  Drink plenty of fluids but you should avoid alcoholic beverages for 24 hours.  ACTIVITY:  You should plan to take it easy for the rest of today and you should NOT  DRIVE or use heavy machinery until tomorrow (because of the sedation medicines used during the test).    FOLLOW UP: Our staff will call the number listed on your records the next business day following your procedure to check on you and address any questions or concerns that you may have regarding the information given to you following your procedure. If we do not reach you, we will leave a message.  However, if you are feeling well and you are not experiencing any problems, there is no need to return our call.  We will assume that you have returned to your regular daily activities without incident.  If any biopsies were taken you will be contacted by phone or by letter within the next 1-3 weeks.  Please call us at [phone] if you have not heard about the biopsies in 3 weeks.    SIGNATURES/CONFIDENTIALITY: You and/or your care partner have signed paperwork which will be entered into your electronic medical record.  These signatures attest to the fact that that the information above on your After Visit Summary has been reviewed and is understood.  Full responsibility of the confidentiality of this discharge information lies with you and/or your care-partner. 

## 2017-07-11 NOTE — Op Note (Signed)
[facility] Patient Name: Chaz Ronning Procedure Date: 07/11/2017 8:26 AM MRN: [id number] Endoscopist: Mallie Mussel L. Loletha Carrow , MD Age: 71 Referring MD:  Date of Birth: 09/01/1945 Gender: Female Account #: [id number] Procedure:                Colonoscopy Indications:              Surveillance: Personal history of adenomatous                            polyps on last colonoscopy 5 years ago (two TA                            polyps, each < 53mm; 01/2012) Medicines:                Monitored Anesthesia Care Procedure:                Pre-Anesthesia Assessment:                           - Prior to the procedure, a History and Physical                            was performed, and patient medications and                            allergies were reviewed. The patient's tolerance of                            previous anesthesia was also reviewed. The risks                            and benefits of the procedure and the sedation                            options and risks were discussed with the patient.                            All questions were answered, and informed consent                            was obtained. Prior Anticoagulants: The patient has                            taken no previous anticoagulant or antiplatelet                            agents. ASA Grade Assessment: I - A normal, healthy                            patient. After reviewing the risks and benefits,                            the patient was deemed in satisfactory condition to  undergo the procedure.                           After obtaining informed consent, the colonoscope                            was passed under direct vision. Throughout the                            procedure, the patient's blood pressure, pulse, and                            oxygen saturations were monitored continuously. The                            Colonoscope was introduced through the anus and                             advanced to the the cecum, identified by                            appendiceal orifice and ileocecal valve. The                            colonoscopy was performed without difficulty. The                            patient tolerated the procedure well. The quality                            of the bowel preparation was good. The ileocecal                            valve, appendiceal orifice, and rectum were                            photographed. The quality of the bowel preparation                            was evaluated using the BBPS [facility] Bowel                            Preparation Scale) with scores of: Right Colon = 2,                            Transverse Colon = 2 and Left Colon = 2. The total                            BBPS score equals 6. After lavage. The bowel                            preparation used was SUPREP. Scope In: 8:36:57 AM Scope Out: 9:03:33 AM Scope Withdrawal Time: 0 hours 22 minutes 59 seconds  Total  Procedure Duration: 0 hours 26 minutes 36 seconds  Findings:                 The perianal and digital rectal examinations were                            normal.                           Four sessile polyps were found in the proximal                            ascending colon. The polyps were 6 to 8 mm in size.                            These polyps were removed with a cold snare.                            Resection and retrieval were complete.                           Two sessile polyps were found in the transverse                            colon. The polyps were 4 to 6 mm in size. These                            polyps were removed with a cold snare. Resection                            and retrieval were complete.                           A 3 mm polyp was found in the transverse colon. The                            polyp was sessile. The polyp was removed with a                            cold biopsy forceps. Resection  and retrieval were                            complete.                           A 8 mm polyp was found in the rectum. The polyp was                            pedunculated. The polyp was removed with a hot                            snare. Resection and retrieval were complete.  A few diverticula were found in the left colon.                           The exam was otherwise without abnormality on                            direct and retroflexion views. Complications:            No immediate complications. Estimated Blood Loss:     Estimated blood loss was minimal. Impression:               - Four 6 to 8 mm polyps in the proximal ascending                            colon, removed with a cold snare. Resected and                            retrieved.                           - Two 4 to 6 mm polyps in the transverse colon,                            removed with a cold snare. Resected and retrieved.                           - One 3 mm polyp in the transverse colon, removed                            with a cold biopsy forceps. Resected and retrieved.                           - One 8 mm polyp in the rectum, removed with a hot                            snare. Resected and retrieved.                           - Diverticulosis in the left colon.                           - The examination was otherwise normal on direct                            and retroflexion views. Recommendation:           - Patient has a contact number available for                            emergencies. The signs and symptoms of potential                            delayed complications were discussed with the  patient. Return to normal activities tomorrow.                            Written discharge instructions were provided to the                            patient.                           - Resume previous diet.                           - Continue present  medications.                           - Await pathology results.                           - Repeat colonoscopy is recommended for                            surveillance. The colonoscopy date will be                            determined after pathology results from today's                            exam become available for review. Henry L. Loletha Carrow, MD 07/11/2017 9:11:03 AM This report has been signed electronically.

## 2017-07-12 ENCOUNTER — Telehealth: Payer: Self-pay | Admitting: *Deleted

## 2017-07-12 NOTE — Telephone Encounter (Signed)
  Follow up Call-  Call back number 07/11/2017  Post procedure Call Back phone  # [phone]  Permission to leave phone message Yes  Some recent data might be hidden     Patient questions:  Do you have a fever, pain , or abdominal swelling? No. Pain Score  0 *  Have you tolerated food without any problems? Yes.    Have you been able to return to your normal activities? Yes.    Do you have any questions about your discharge instructions: Diet   No. Medications  No. Follow up visit  No.  Do you have questions or concerns about your Care? No.  Actions: * If pain score is 4 or above: No action needed, pain <4.

## 2017-07-18 DIAGNOSIS — N3021 Other chronic cystitis with hematuria: Secondary | ICD-10-CM | POA: Diagnosis not present

## 2017-07-18 DIAGNOSIS — R3915 Urgency of urination: Secondary | ICD-10-CM | POA: Diagnosis not present

## 2017-07-22 ENCOUNTER — Encounter: Payer: Self-pay | Admitting: Gastroenterology

## 2017-07-24 ENCOUNTER — Encounter: Payer: Self-pay | Admitting: Gastroenterology

## 2017-08-19 DIAGNOSIS — D3132 Benign neoplasm of left choroid: Secondary | ICD-10-CM | POA: Diagnosis not present

## 2017-08-19 DIAGNOSIS — Z83511 Family history of glaucoma: Secondary | ICD-10-CM | POA: Diagnosis not present

## 2017-08-19 DIAGNOSIS — H25013 Cortical age-related cataract, bilateral: Secondary | ICD-10-CM | POA: Diagnosis not present

## 2017-08-19 DIAGNOSIS — H52203 Unspecified astigmatism, bilateral: Secondary | ICD-10-CM | POA: Diagnosis not present

## 2017-08-19 DIAGNOSIS — H2513 Age-related nuclear cataract, bilateral: Secondary | ICD-10-CM | POA: Diagnosis not present

## 2017-08-19 DIAGNOSIS — H524 Presbyopia: Secondary | ICD-10-CM | POA: Diagnosis not present

## 2017-08-19 DIAGNOSIS — H5203 Hypermetropia, bilateral: Secondary | ICD-10-CM | POA: Diagnosis not present

## 2017-12-24 DIAGNOSIS — M859 Disorder of bone density and structure, unspecified: Secondary | ICD-10-CM | POA: Diagnosis not present

## 2017-12-24 DIAGNOSIS — R7301 Impaired fasting glucose: Secondary | ICD-10-CM | POA: Diagnosis not present

## 2017-12-24 DIAGNOSIS — R82998 Other abnormal findings in urine: Secondary | ICD-10-CM | POA: Diagnosis not present

## 2017-12-24 DIAGNOSIS — E7849 Other hyperlipidemia: Secondary | ICD-10-CM | POA: Diagnosis not present

## 2017-12-31 DIAGNOSIS — E559 Vitamin D deficiency, unspecified: Secondary | ICD-10-CM | POA: Diagnosis not present

## 2017-12-31 DIAGNOSIS — N390 Urinary tract infection, site not specified: Secondary | ICD-10-CM | POA: Diagnosis not present

## 2017-12-31 DIAGNOSIS — M859 Disorder of bone density and structure, unspecified: Secondary | ICD-10-CM | POA: Diagnosis not present

## 2017-12-31 DIAGNOSIS — E669 Obesity, unspecified: Secondary | ICD-10-CM | POA: Diagnosis not present

## 2017-12-31 DIAGNOSIS — F329 Major depressive disorder, single episode, unspecified: Secondary | ICD-10-CM | POA: Diagnosis not present

## 2017-12-31 DIAGNOSIS — Z0000 Encounter for general adult medical examination without abnormal findings: Secondary | ICD-10-CM | POA: Diagnosis not present

## 2017-12-31 DIAGNOSIS — R7301 Impaired fasting glucose: Secondary | ICD-10-CM | POA: Diagnosis not present

## 2017-12-31 DIAGNOSIS — E7849 Other hyperlipidemia: Secondary | ICD-10-CM | POA: Diagnosis not present

## 2017-12-31 DIAGNOSIS — Z6835 Body mass index (BMI) 35.0-35.9, adult: Secondary | ICD-10-CM | POA: Diagnosis not present

## 2017-12-31 DIAGNOSIS — J45909 Unspecified asthma, uncomplicated: Secondary | ICD-10-CM | POA: Diagnosis not present

## 2018-01-07 ENCOUNTER — Other Ambulatory Visit: Payer: Self-pay | Admitting: Internal Medicine

## 2018-01-07 DIAGNOSIS — Z139 Encounter for screening, unspecified: Secondary | ICD-10-CM

## 2018-01-28 ENCOUNTER — Ambulatory Visit
Admission: RE | Admit: 2018-01-28 | Discharge: 2018-01-28 | Disposition: A | Discharge status: Home / Self Care | Payer: Medicare Other | Source: Ambulatory Visit | Attending: Internal Medicine | Admitting: Internal Medicine

## 2018-01-28 DIAGNOSIS — Z1231 Encounter for screening mammogram for malignant neoplasm of breast: Secondary | ICD-10-CM | POA: Diagnosis not present

## 2018-01-28 DIAGNOSIS — Z139 Encounter for screening, unspecified: Secondary | ICD-10-CM

## 2018-06-04 DIAGNOSIS — Z23 Encounter for immunization: Secondary | ICD-10-CM | POA: Diagnosis not present

## 2018-08-21 DIAGNOSIS — H2513 Age-related nuclear cataract, bilateral: Secondary | ICD-10-CM | POA: Diagnosis not present

## 2018-08-21 DIAGNOSIS — H5203 Hypermetropia, bilateral: Secondary | ICD-10-CM | POA: Diagnosis not present

## 2018-08-21 DIAGNOSIS — Z83511 Family history of glaucoma: Secondary | ICD-10-CM | POA: Diagnosis not present

## 2018-08-21 DIAGNOSIS — H52203 Unspecified astigmatism, bilateral: Secondary | ICD-10-CM | POA: Diagnosis not present

## 2018-08-21 DIAGNOSIS — H25013 Cortical age-related cataract, bilateral: Secondary | ICD-10-CM | POA: Diagnosis not present

## 2018-08-21 DIAGNOSIS — H524 Presbyopia: Secondary | ICD-10-CM | POA: Diagnosis not present

## 2018-08-21 DIAGNOSIS — D3132 Benign neoplasm of left choroid: Secondary | ICD-10-CM | POA: Diagnosis not present

## 2018-12-31 DIAGNOSIS — R7301 Impaired fasting glucose: Secondary | ICD-10-CM | POA: Diagnosis not present

## 2018-12-31 DIAGNOSIS — E7849 Other hyperlipidemia: Secondary | ICD-10-CM | POA: Diagnosis not present

## 2018-12-31 DIAGNOSIS — M859 Disorder of bone density and structure, unspecified: Secondary | ICD-10-CM | POA: Diagnosis not present

## 2019-01-01 DIAGNOSIS — R82998 Other abnormal findings in urine: Secondary | ICD-10-CM | POA: Diagnosis not present

## 2019-01-07 DIAGNOSIS — J45909 Unspecified asthma, uncomplicated: Secondary | ICD-10-CM | POA: Diagnosis not present

## 2019-01-07 DIAGNOSIS — Z0000 Encounter for general adult medical examination without abnormal findings: Secondary | ICD-10-CM | POA: Diagnosis not present

## 2019-01-07 DIAGNOSIS — F325 Major depressive disorder, single episode, in full remission: Secondary | ICD-10-CM | POA: Diagnosis not present

## 2019-01-07 DIAGNOSIS — E785 Hyperlipidemia, unspecified: Secondary | ICD-10-CM | POA: Diagnosis not present

## 2019-01-07 DIAGNOSIS — M222X9 Patellofemoral disorders, unspecified knee: Secondary | ICD-10-CM | POA: Diagnosis not present

## 2019-01-07 DIAGNOSIS — M8580 Other specified disorders of bone density and structure, unspecified site: Secondary | ICD-10-CM | POA: Diagnosis not present

## 2019-01-07 DIAGNOSIS — N390 Urinary tract infection, site not specified: Secondary | ICD-10-CM | POA: Diagnosis not present

## 2019-01-07 DIAGNOSIS — R5383 Other fatigue: Secondary | ICD-10-CM | POA: Diagnosis not present

## 2019-01-07 DIAGNOSIS — E669 Obesity, unspecified: Secondary | ICD-10-CM | POA: Diagnosis not present

## 2019-01-07 DIAGNOSIS — E559 Vitamin D deficiency, unspecified: Secondary | ICD-10-CM | POA: Diagnosis not present

## 2019-01-07 DIAGNOSIS — R7301 Impaired fasting glucose: Secondary | ICD-10-CM | POA: Diagnosis not present

## 2019-01-07 DIAGNOSIS — R252 Cramp and spasm: Secondary | ICD-10-CM | POA: Diagnosis not present

## 2019-05-16 DIAGNOSIS — Z23 Encounter for immunization: Secondary | ICD-10-CM | POA: Diagnosis not present

## 2019-09-14 ENCOUNTER — Other Ambulatory Visit: Payer: Self-pay

## 2019-09-14 ENCOUNTER — Ambulatory Visit (INDEPENDENT_AMBULATORY_CARE_PROVIDER_SITE_OTHER): Payer: Medicare Other | Admitting: Otolaryngology

## 2019-09-14 VITALS — Temp 97.5°F

## 2019-09-14 DIAGNOSIS — H6123 Impacted cerumen, bilateral: Secondary | ICD-10-CM | POA: Diagnosis not present

## 2019-09-14 NOTE — Progress Notes (Signed)
HPI: Olivia Velazquez is a 74 y.o. female who presents for evaluation of ear wax buildup in both ears much worse on the left.  She has previously seen Dr. Ernesto Rutherford but has not seen him for several years.  She does not like the ears washed out.  She notices decreased hearing worse on the left side..  Past Medical History:  Diagnosis Date  . Bladder infection, chronic   . Ear infection   . Hemorrhoids   . Pilonidal cyst    Past Surgical History:  Procedure Laterality Date  . COLONOSCOPY W/ BIOPSIES AND POLYPECTOMY  2007   adenomatous polps/ Dr. Deatra Ina  . TUBAL LIGATION  1982   Social History   Socioeconomic History  . Marital status: Married    Spouse name: Not on file  . Number of children: Not on file  . Years of education: Not on file  . Highest education level: Not on file  Occupational History  . Not on file  Tobacco Use  . Smoking status: Never Smoker  . Smokeless tobacco: Never Used  Substance and Sexual Activity  . Alcohol use: No  . Drug use: No  . Sexual activity: Not on file  Other Topics Concern  . Not on file  Social History Narrative  . Not on file   Social Determinants of Health   Financial Resource Strain:   . Difficulty of Paying Living Expenses: Not on file  Food Insecurity:   . Worried About Charity fundraiser in the Last Year: Not on file  . Ran Out of Food in the Last Year: Not on file  Transportation Needs:   . Lack of Transportation (Medical): Not on file  . Lack of Transportation (Non-Medical): Not on file  Physical Activity:   . Days of Exercise per Week: Not on file  . Minutes of Exercise per Session: Not on file  Stress:   . Feeling of Stress : Not on file  Social Connections:   . Frequency of Communication with Friends and Family: Not on file  . Frequency of Social Gatherings with Friends and Family: Not on file  . Attends Religious Services: Not on file  . Active Member of Clubs or Organizations: Not on file  . Attends Theatre manager Meetings: Not on file  . Marital Status: Not on file   Family History  Problem Relation Age of Onset  . Colon cancer Paternal Uncle 1  . Stomach cancer Neg Hx   . Esophageal cancer Neg Hx   . Rectal cancer Neg Hx    Allergies  Allergen Reactions  . Codeine Nausea Only and Other (See Comments)    Other:migraine/headach   Prior to Admission medications   Medication Sig Start Date End Date Taking? Authorizing Provider  nitrofurantoin (MACRODANTIN) 100 MG capsule Take 100 mg by mouth daily.   Yes [provider]  OVER THE COUNTER MEDICATION [employer] brand stool softener   Yes [provider]  PRESCRIPTION MEDICATION    Yes [provider]  PROCTOZONE-HC 2.5 % rectal cream PLACE RECTALLY TWICE DAILY 03/18/14  Yes Inda Castle, MD     Positive ROS: Otherwise negative  All other systems have been reviewed and were otherwise negative with the exception of those mentioned in the HPI and as above.  Physical Exam: Constitutional: Alert, well-appearing, no acute distress Ears: External ears without lesions or tenderness. Ear canals left ear canal is completely occluded right ear canal is partially occluded.. Nasal: External nose  without lesions. Clear nasal passages Oral: Oropharynx clear. Neck: No palpable adenopathy or masses Respiratory: Breathing comfortably  Skin: No facial/neck lesions or rash noted.  Cerumen impaction removal  Date/Time: 09/14/2019 5:55 PM Performed by: Rozetta Nunnery, MD Authorized by: Rozetta Nunnery, MD   Consent:    Consent obtained:  Verbal   Consent given by:  Patient   Risks discussed:  Pain Procedure details:    Location:  L ear and R ear   Procedure type: curette and suction   Post-procedure details:    Hearing quality:  Improved   Patient tolerance of procedure:  Tolerated with difficulty Comments:     Both TMs were clear.    Assessment: Cerumen impactions  Plan: She will follow-up as  needed  Radene Journey, MD

## 2019-12-03 DIAGNOSIS — N390 Urinary tract infection, site not specified: Secondary | ICD-10-CM | POA: Diagnosis not present

## 2019-12-11 DIAGNOSIS — Z23 Encounter for immunization: Secondary | ICD-10-CM | POA: Diagnosis not present

## 2020-01-25 DIAGNOSIS — R7301 Impaired fasting glucose: Secondary | ICD-10-CM | POA: Diagnosis not present

## 2020-01-25 DIAGNOSIS — E559 Vitamin D deficiency, unspecified: Secondary | ICD-10-CM | POA: Diagnosis not present

## 2020-01-25 DIAGNOSIS — R82998 Other abnormal findings in urine: Secondary | ICD-10-CM | POA: Diagnosis not present

## 2020-01-25 DIAGNOSIS — E7849 Other hyperlipidemia: Secondary | ICD-10-CM | POA: Diagnosis not present

## 2020-02-27 DIAGNOSIS — Z23 Encounter for immunization: Secondary | ICD-10-CM | POA: Diagnosis not present

## 2020-03-02 DIAGNOSIS — Z83511 Family history of glaucoma: Secondary | ICD-10-CM | POA: Diagnosis not present

## 2020-03-02 DIAGNOSIS — H52203 Unspecified astigmatism, bilateral: Secondary | ICD-10-CM | POA: Diagnosis not present

## 2020-03-02 DIAGNOSIS — H25043 Posterior subcapsular polar age-related cataract, bilateral: Secondary | ICD-10-CM | POA: Diagnosis not present

## 2020-03-02 DIAGNOSIS — H35363 Drusen (degenerative) of macula, bilateral: Secondary | ICD-10-CM | POA: Diagnosis not present

## 2020-03-02 DIAGNOSIS — H2513 Age-related nuclear cataract, bilateral: Secondary | ICD-10-CM | POA: Diagnosis not present

## 2020-03-02 DIAGNOSIS — D3132 Benign neoplasm of left choroid: Secondary | ICD-10-CM | POA: Diagnosis not present

## 2020-03-02 DIAGNOSIS — H353131 Nonexudative age-related macular degeneration, bilateral, early dry stage: Secondary | ICD-10-CM | POA: Diagnosis not present

## 2020-03-02 DIAGNOSIS — H524 Presbyopia: Secondary | ICD-10-CM | POA: Diagnosis not present

## 2020-03-02 DIAGNOSIS — H5203 Hypermetropia, bilateral: Secondary | ICD-10-CM | POA: Diagnosis not present

## 2020-03-02 DIAGNOSIS — H25013 Cortical age-related cataract, bilateral: Secondary | ICD-10-CM | POA: Diagnosis not present

## 2020-05-31 DIAGNOSIS — N390 Urinary tract infection, site not specified: Secondary | ICD-10-CM | POA: Diagnosis not present

## 2020-06-25 DIAGNOSIS — B029 Zoster without complications: Secondary | ICD-10-CM | POA: Diagnosis not present

## 2020-07-21 DIAGNOSIS — N390 Urinary tract infection, site not specified: Secondary | ICD-10-CM | POA: Diagnosis not present

## 2020-07-21 DIAGNOSIS — H2513 Age-related nuclear cataract, bilateral: Secondary | ICD-10-CM | POA: Diagnosis not present

## 2020-07-21 DIAGNOSIS — H5203 Hypermetropia, bilateral: Secondary | ICD-10-CM | POA: Diagnosis not present

## 2020-07-21 DIAGNOSIS — H25013 Cortical age-related cataract, bilateral: Secondary | ICD-10-CM | POA: Diagnosis not present

## 2020-07-21 DIAGNOSIS — H353131 Nonexudative age-related macular degeneration, bilateral, early dry stage: Secondary | ICD-10-CM | POA: Diagnosis not present

## 2020-09-27 ENCOUNTER — Encounter: Payer: Self-pay | Admitting: Gastroenterology

## 2020-11-22 DIAGNOSIS — N390 Urinary tract infection, site not specified: Secondary | ICD-10-CM | POA: Diagnosis not present

## 2020-11-22 DIAGNOSIS — N61 Mastitis without abscess: Secondary | ICD-10-CM | POA: Diagnosis not present

## 2020-11-22 DIAGNOSIS — B379 Candidiasis, unspecified: Secondary | ICD-10-CM | POA: Diagnosis not present

## 2020-11-22 DIAGNOSIS — R350 Frequency of micturition: Secondary | ICD-10-CM | POA: Diagnosis not present

## 2020-11-29 DIAGNOSIS — B372 Candidiasis of skin and nail: Secondary | ICD-10-CM | POA: Diagnosis not present

## 2020-11-29 DIAGNOSIS — N390 Urinary tract infection, site not specified: Secondary | ICD-10-CM | POA: Diagnosis not present

## 2020-11-30 DIAGNOSIS — N390 Urinary tract infection, site not specified: Secondary | ICD-10-CM | POA: Diagnosis not present

## 2020-12-15 DIAGNOSIS — R058 Other specified cough: Secondary | ICD-10-CM | POA: Diagnosis not present

## 2020-12-15 DIAGNOSIS — N390 Urinary tract infection, site not specified: Secondary | ICD-10-CM | POA: Diagnosis not present

## 2020-12-15 DIAGNOSIS — R300 Dysuria: Secondary | ICD-10-CM | POA: Diagnosis not present

## 2020-12-15 DIAGNOSIS — R0981 Nasal congestion: Secondary | ICD-10-CM | POA: Diagnosis not present

## 2020-12-15 DIAGNOSIS — B379 Candidiasis, unspecified: Secondary | ICD-10-CM | POA: Diagnosis not present

## 2020-12-15 DIAGNOSIS — U071 COVID-19: Secondary | ICD-10-CM | POA: Diagnosis not present

## 2020-12-15 DIAGNOSIS — Z20822 Contact with and (suspected) exposure to covid-19: Secondary | ICD-10-CM | POA: Diagnosis not present

## 2021-01-30 DIAGNOSIS — M8589 Other specified disorders of bone density and structure, multiple sites: Secondary | ICD-10-CM | POA: Diagnosis not present

## 2021-01-30 DIAGNOSIS — E559 Vitamin D deficiency, unspecified: Secondary | ICD-10-CM | POA: Diagnosis not present

## 2021-01-30 DIAGNOSIS — R7301 Impaired fasting glucose: Secondary | ICD-10-CM | POA: Diagnosis not present

## 2021-01-30 DIAGNOSIS — E785 Hyperlipidemia, unspecified: Secondary | ICD-10-CM | POA: Diagnosis not present

## 2021-01-30 DIAGNOSIS — Z0000 Encounter for general adult medical examination without abnormal findings: Secondary | ICD-10-CM | POA: Diagnosis not present

## 2021-02-02 DIAGNOSIS — N1831 Chronic kidney disease, stage 3a: Secondary | ICD-10-CM | POA: Diagnosis not present

## 2021-02-02 DIAGNOSIS — M8580 Other specified disorders of bone density and structure, unspecified site: Secondary | ICD-10-CM | POA: Diagnosis not present

## 2021-02-02 DIAGNOSIS — G3184 Mild cognitive impairment, so stated: Secondary | ICD-10-CM | POA: Diagnosis not present

## 2021-02-02 DIAGNOSIS — Z1331 Encounter for screening for depression: Secondary | ICD-10-CM | POA: Diagnosis not present

## 2021-02-02 DIAGNOSIS — R300 Dysuria: Secondary | ICD-10-CM | POA: Diagnosis not present

## 2021-02-02 DIAGNOSIS — Z0000 Encounter for general adult medical examination without abnormal findings: Secondary | ICD-10-CM | POA: Diagnosis not present

## 2021-02-02 DIAGNOSIS — Z1389 Encounter for screening for other disorder: Secondary | ICD-10-CM | POA: Diagnosis not present

## 2021-02-02 DIAGNOSIS — E669 Obesity, unspecified: Secondary | ICD-10-CM | POA: Diagnosis not present

## 2021-02-02 DIAGNOSIS — F3341 Major depressive disorder, recurrent, in partial remission: Secondary | ICD-10-CM | POA: Diagnosis not present

## 2021-02-02 DIAGNOSIS — E785 Hyperlipidemia, unspecified: Secondary | ICD-10-CM | POA: Diagnosis not present

## 2021-02-02 DIAGNOSIS — R7301 Impaired fasting glucose: Secondary | ICD-10-CM | POA: Diagnosis not present

## 2021-05-16 DIAGNOSIS — N390 Urinary tract infection, site not specified: Secondary | ICD-10-CM | POA: Diagnosis not present

## 2021-07-28 ENCOUNTER — Encounter (HOSPITAL_BASED_OUTPATIENT_CLINIC_OR_DEPARTMENT_OTHER): Payer: Self-pay | Admitting: Emergency Medicine

## 2021-07-28 ENCOUNTER — Emergency Department (HOSPITAL_BASED_OUTPATIENT_CLINIC_OR_DEPARTMENT_OTHER): Payer: Medicare HMO

## 2021-07-28 ENCOUNTER — Other Ambulatory Visit: Payer: Self-pay

## 2021-07-28 ENCOUNTER — Emergency Department (HOSPITAL_BASED_OUTPATIENT_CLINIC_OR_DEPARTMENT_OTHER)
Admission: EM | Admit: 2021-07-28 | Discharge: 2021-07-28 | Disposition: A | Discharge status: Home / Self Care | Payer: Medicare HMO | Attending: Emergency Medicine | Admitting: Emergency Medicine

## 2021-07-28 DIAGNOSIS — R300 Dysuria: Secondary | ICD-10-CM | POA: Diagnosis present

## 2021-07-28 DIAGNOSIS — N3000 Acute cystitis without hematuria: Secondary | ICD-10-CM | POA: Diagnosis not present

## 2021-07-28 DIAGNOSIS — R0602 Shortness of breath: Secondary | ICD-10-CM | POA: Diagnosis not present

## 2021-07-28 DIAGNOSIS — N390 Urinary tract infection, site not specified: Secondary | ICD-10-CM | POA: Diagnosis not present

## 2021-07-28 DIAGNOSIS — R0789 Other chest pain: Secondary | ICD-10-CM | POA: Diagnosis not present

## 2021-07-28 DIAGNOSIS — I10 Essential (primary) hypertension: Secondary | ICD-10-CM | POA: Diagnosis not present

## 2021-07-28 DIAGNOSIS — R072 Precordial pain: Secondary | ICD-10-CM | POA: Diagnosis not present

## 2021-07-28 DIAGNOSIS — R079 Chest pain, unspecified: Secondary | ICD-10-CM | POA: Diagnosis not present

## 2021-07-28 HISTORY — DX: Essential (primary) hypertension: I10

## 2021-07-28 LAB — URINALYSIS, ROUTINE W REFLEX MICROSCOPIC
Bilirubin Urine: NEGATIVE
Glucose, UA: NEGATIVE mg/dL
Ketones, ur: NEGATIVE mg/dL
Nitrite: NEGATIVE
Protein, ur: NEGATIVE mg/dL
Specific Gravity, Urine: 1.015 (ref 1.005–1.030)
pH: 5.5 (ref 5.0–8.0)

## 2021-07-28 LAB — COMPREHENSIVE METABOLIC PANEL
ALT: 13 U/L (ref 0–44)
AST: 16 U/L (ref 15–41)
Albumin: 3.8 g/dL (ref 3.5–5.0)
Alkaline Phosphatase: 87 U/L (ref 38–126)
Anion gap: 6 (ref 5–15)
BUN: 19 mg/dL (ref 8–23)
CO2: 23 mmol/L (ref 22–32)
Calcium: 9.2 mg/dL (ref 8.9–10.3)
Chloride: 108 mmol/L (ref 98–111)
Creatinine, Ser: 0.93 mg/dL (ref 0.44–1.00)
GFR, Estimated: >60 mL/min (ref >60)
Glucose, Bld: 93 mg/dL (ref 70–99)
Potassium: 3.9 mmol/L (ref 3.5–5.1)
Sodium: 137 mmol/L (ref 135–145)
Total Bilirubin: 0.7 mg/dL (ref 0.3–1.2)
Total Protein: 6.1 g/dL — ABNORMAL LOW (ref 6.5–8.1)

## 2021-07-28 LAB — URINALYSIS, MICROSCOPIC (REFLEX)

## 2021-07-28 LAB — CBC WITH DIFFERENTIAL/PLATELET
Abs Immature Granulocytes: 0.02 10*3/uL (ref 0.00–0.07)
Basophils Absolute: 0 10*3/uL (ref 0.0–0.1)
Basophils Relative: 1 %
Eosinophils Absolute: 0.2 10*3/uL (ref 0.0–0.5)
Eosinophils Relative: 3 %
HCT: 42 % (ref 36.0–46.0)
Hemoglobin: 14.2 g/dL (ref 12.0–15.0)
Immature Granulocytes: 0 %
Lymphocytes Relative: 25 %
Lymphs Abs: 1.7 10*3/uL (ref 0.7–4.0)
MCH: 31.5 pg (ref 26.0–34.0)
MCHC: 33.8 g/dL (ref 30.0–36.0)
MCV: 93.1 fL (ref 80.0–100.0)
Monocytes Absolute: 0.5 10*3/uL (ref 0.1–1.0)
Monocytes Relative: 7 %
Neutro Abs: 4.6 10*3/uL (ref 1.7–7.7)
Neutrophils Relative %: 64 %
Platelets: 227 10*3/uL (ref 150–400)
RBC: 4.51 MIL/uL (ref 3.87–5.11)
RDW: 13.1 % (ref 11.5–15.5)
WBC: 7.1 10*3/uL (ref 4.0–10.5)
nRBC: 0 % (ref 0.0–0.2)

## 2021-07-28 LAB — TROPONIN I (HIGH SENSITIVITY)
Troponin I (High Sensitivity): 2 ng/L (ref <18)
Troponin I (High Sensitivity): 3 ng/L (ref <18)

## 2021-07-28 MED ORDER — CEPHALEXIN 500 MG PO CAPS: 500.0000 mg | ORAL_CAPSULE | Freq: Three times a day (TID) | ORAL | 0 refills | Status: AC

## 2021-07-28 NOTE — ED Triage Notes (Signed)
Intermittent Chest pain this am.  Some sob with pain.  Pt also having dysuria x 2 days.

## 2021-07-28 NOTE — Discharge Instructions (Signed)
Please read and follow all provided instructions.  Your diagnoses today include:  1. Acute cystitis without hematuria   2. Precordial pain     Tests performed today include: An EKG of your heart -no problems seen A chest x-ray Cardiac enzymes - a blood test for heart muscle damage, were both normal and did not show any signs of heart attack Blood counts and electrolytes Urine test: A few white blood cells noted which could indicate infection Urine culture: Pending Vital signs. See below for your results today.   Medications prescribed:  Keflex (cephalexin) - antibiotic  You have been prescribed an antibiotic medicine: take the entire course of medicine even if you are feeling better. Stopping early can cause the antibiotic not to work.  Take any prescribed medications only as directed.  Follow-up instructions: Please follow-up with your primary care provider in 5 days for further evaluation of your symptoms.   Return instructions:  SEEK IMMEDIATE MEDICAL ATTENTION IF: You have severe chest pain, especially if the pain is crushing or pressure-like and spreads to the arms, back, neck, or jaw, or if you have sweating, nausea or vomiting, or trouble with breathing. THIS IS AN EMERGENCY. Do not wait to see if the pain will go away. Get medical help at once. Call 911. DO NOT drive yourself to the hospital.  Your chest pain gets worse and does not go away after a few minutes of rest.  You have an attack of chest pain lasting longer than what you usually experience.  You have significant dizziness, if you pass out, or have trouble walking.  You have chest pain not typical of your usual pain for which you originally saw your caregiver.  You have any other emergent concerns regarding your health.  Additional Information: Chest pain comes from many different causes. Your caregiver has diagnosed you as having chest pain that is not specific for one problem, but does not require admission.   You are at low risk for an acute heart condition or other serious illness.   Your vital signs today were: BP 137/63    Pulse 84    Temp 98.5 F (36.9 C) (Oral)    Resp (!) 31    Ht 5\' 2"  (1.575 m)    Wt 95.3 kg    SpO2 99%    BMI 38.41 kg/m  If your blood pressure (BP) was elevated above 135/85 this visit, please have this repeated by your doctor within one month. --------------

## 2021-07-28 NOTE — ED Provider Notes (Signed)
[facility] Provider Note   CSN: [id number] Arrival date & time: 07/28/21  1020     History Chief Complaint  Patient presents with   Chest Pain   Dysuria    Olivia Velazquez is a 75 y.o. female.  Patient with history of frequent UTIs presents the emergency department for 2 complaints today.  She states that her initial reason for coming was increased urinary frequency and suprapubic pressure.  Her last UTI was "several months ago".  No associated fevers, flank pain, vomiting.  No blood noted in the urine.  In addition, patient states that she has had an episode of chest pain this morning.  She has had intermittent chest pains over the past several months.  Typically these occur approximately once a week, lasting 5 minutes.  Today her symptoms started around 8 AM.  Started at rest.  She had central chest pain without radiation.  It was more intense than usual and lasted about 45 minutes.  Resolved spontaneously.  No radiation of pain.  No associated vomiting or diaphoresis.  No lower extremity swelling.  No abdominal pain.  No cough, fevers, or shortness of breath.  She reports borderline history of hypertension, unknown history of high cholesterol.  No diabetes, tobacco use, family history of coronary artery disease.      Past Medical History:  Diagnosis Date   Bladder infection, chronic    Ear infection    Hemorrhoids    Hypertension    Pilonidal cyst     Patient Active Problem List   Diagnosis Date Noted   Hemorrhoids 04/30/2013    Past Surgical History:  Procedure Laterality Date   COLONOSCOPY W/ BIOPSIES AND POLYPECTOMY  2007   adenomatous polps/ Dr. Deatra Ina   TUBAL LIGATION  1982     OB History   No obstetric history on file.     Family History  Problem Relation Age of Onset   Colon cancer Paternal Uncle 61   Stomach cancer Neg Hx    Esophageal cancer Neg Hx    Rectal cancer Neg Hx     Social History   Tobacco Use    Smoking status: Never   Smokeless tobacco: Never  Vaping Use   Vaping Use: Never used  Substance Use Topics   Alcohol use: No   Drug use: No    Home Medications Prior to Admission medications   Medication Sig Start Date End Date Taking? Authorizing Provider  nitrofurantoin (MACRODANTIN) 100 MG capsule Take 100 mg by mouth daily.    [provider]  OVER THE COUNTER MEDICATION [employer] brand stool softener    [provider]  PRESCRIPTION MEDICATION     [provider]  PROCTOZONE-HC 2.5 % rectal cream PLACE RECTALLY TWICE DAILY 03/18/14   Inda Castle, MD    Allergies    Codeine  Review of Systems   Review of Systems  Constitutional:  Negative for diaphoresis and fever.  Eyes:  Negative for redness.  Respiratory:  Negative for cough and shortness of breath.   Cardiovascular:  Positive for chest pain. Negative for palpitations and leg swelling.  Gastrointestinal:  Positive for abdominal pain (suprapubic pressure). Negative for nausea and vomiting.  Genitourinary:  Positive for frequency. Negative for dysuria.  Musculoskeletal:  Negative for back pain and neck pain.  Skin:  Negative for rash.  Neurological:  Negative for syncope and light-headedness.  Psychiatric/Behavioral:  The patient is not nervous/anxious.    Physical Exam Updated Vital  Signs BP (!) 148/88 (BP Location: Left Arm)    Pulse 82    Temp 98.5 F (36.9 C) (Oral)    Resp 20    Ht 5\' 2"  (1.575 m)    Wt 95.3 kg    SpO2 100%    BMI 38.41 kg/m   Physical Exam Vitals and nursing note reviewed.  Constitutional:      General: She is not in acute distress.    Appearance: She is well-developed. She is not diaphoretic.  HENT:     Head: Normocephalic and atraumatic.     Right Ear: External ear normal.     Left Ear: External ear normal.     Nose: Nose normal.     Mouth/Throat:     Mouth: Mucous membranes are not dry.  Eyes:     Conjunctiva/sclera: Conjunctivae normal.  Neck:      Vascular: Normal carotid pulses. No JVD.     Trachea: Trachea normal. No tracheal deviation.  Cardiovascular:     Rate and Rhythm: Normal rate and regular rhythm.     Pulses: No decreased pulses.          Radial pulses are 2+ on the right side and 2+ on the left side.     Heart sounds: Normal heart sounds, S1 normal and S2 normal. No murmur heard. Pulmonary:     Effort: Pulmonary effort is normal. No respiratory distress.     Breath sounds: No wheezing, rhonchi or rales.  Chest:     Chest wall: No tenderness.  Abdominal:     General: Bowel sounds are normal.     Palpations: Abdomen is soft.     Tenderness: There is abdominal tenderness. There is no guarding or rebound.     Comments: Mild suprapubic tenderness  Musculoskeletal:        General: Normal range of motion.     Cervical back: Normal range of motion and neck supple. No muscular tenderness.     Right lower leg: No edema.     Left lower leg: No edema.  Skin:    General: Skin is warm and dry.     Coloration: Skin is not pale.     Findings: No rash.  Neurological:     General: No focal deficit present.     Mental Status: She is alert. Mental status is at baseline.     Motor: No weakness.  Psychiatric:        Mood and Affect: Mood normal.    ED Results / Procedures / Treatments   Labs (all labs ordered are listed, but only abnormal results are displayed) Labs Reviewed - No data to display  EKG EKG Interpretation  Date/Time:  Friday July 28 2021 10:46:22 EST Ventricular Rate:  81 PR Interval:  154 QRS Duration: 74 QT Interval:  356 QTC Calculation: 413 R Axis:   6 Text Interpretation: Normal sinus rhythm Normal ECG Confirmed by Pattricia Boss [phone]) on 07/28/2021 10:49:02 AM  Radiology DG Chest 2 View  Result Date: 07/28/2021 CLINICAL DATA:  Chest pain this morning with shortness of breath. EXAM: CHEST - 2 VIEW COMPARISON:  None. FINDINGS: The heart size and mediastinal contours are within normal limits.  Both lungs are clear. The visualized skeletal structures are unremarkable. IMPRESSION: No active cardiopulmonary disease. Electronically Signed   By: Abigail Miyamoto M.D.   On: 07/28/2021 11:53    Procedures Procedures   Medications Ordered in ED Medications - No data to display  ED Course  I have reviewed the triage vital signs and the nursing notes.  Pertinent labs & imaging results that were available during my care of the patient were reviewed by me and considered in my medical decision making (see chart for details).  Patient seen and examined. Plan discussed with patient.   Labs: CBC, CMP, troponin, UA, EKG personally reviewed  Imaging: Chest x-ray  Medications/Fluids: None  Vital signs reviewed and are as follows: BP (!) 148/88 (BP Location: Left Arm)    Pulse 82    Temp 98.5 F (36.9 C) (Oral)    Resp 20    Ht 5\' 2"  (1.575 m)    Wt 95.3 kg    SpO2 100%    BMI 38.41 kg/m   Initial impression: In regards to UTI symptoms, symptoms are consistent with irritative bladder symptoms.  Will obtain UA and urine culture.  In regards to chest pain, symptoms are atypical.  They are not related to food or activity.  No radiation.  No associated vomiting or diaphoresis.  Patient does have some risk factors.  Cardiac work-up initiated.  It has been over 2 hours since pain resolved.  3:35 PM On re-exam patient appears comfortable.  Discussed results with patient and family at bedside.  Awaiting second troponin at time of final exam.  UA with some white blood cells.  Given symptoms, will start on course of Keflex.  No previous urine cultures for review.  Patient does not member what antibiotics she has been treated with in the past.  They are comfortable with discharge at this time.   Home treatment: Prescription written for Keflex.   Follow-up: Encouraged patient to follow-up with their provider for recheck in 5 days. Encouraged return to ED with recurrent chest pain, shortness of breath,  fever, flank pain. Patient verbalized understanding and agreed with plan.       MDM Rules/Calculators/A&P                         Chest pain: Atypical.  Cardiac work-up negative.  No concerning risk factors for DVT/PE.  Do not suspect pneumonia, dissection, pneumothorax.  Chest x-ray negative.  Irritative urinary symptoms, some white blood cells on UA.  Suspect UTI given patient's history.  Culture sent for additional information.  Started on Keflex.  No signs of pyelonephritis.     Final Clinical Impression(s) / ED Diagnoses Final diagnoses:  Acute cystitis without hematuria  Precordial pain    Rx / DC Orders ED Discharge Orders          Ordered    cephALEXin (KEFLEX) 500 MG capsule  3 times daily        07/28/21 1519             Carlisle Cater, PA-C 07/28/21 1536    Pattricia Boss, MD 08/08/21 1550

## 2021-07-30 LAB — URINE CULTURE

## 2021-08-04 DIAGNOSIS — R0789 Other chest pain: Secondary | ICD-10-CM | POA: Diagnosis not present

## 2021-08-04 DIAGNOSIS — N3000 Acute cystitis without hematuria: Secondary | ICD-10-CM | POA: Diagnosis not present

## 2021-09-15 DIAGNOSIS — H2513 Age-related nuclear cataract, bilateral: Secondary | ICD-10-CM | POA: Diagnosis not present

## 2021-09-15 DIAGNOSIS — H25013 Cortical age-related cataract, bilateral: Secondary | ICD-10-CM | POA: Diagnosis not present

## 2021-09-15 DIAGNOSIS — H353131 Nonexudative age-related macular degeneration, bilateral, early dry stage: Secondary | ICD-10-CM | POA: Diagnosis not present

## 2021-09-15 DIAGNOSIS — H5203 Hypermetropia, bilateral: Secondary | ICD-10-CM | POA: Diagnosis not present

## 2022-06-07 DIAGNOSIS — R7301 Impaired fasting glucose: Secondary | ICD-10-CM | POA: Diagnosis not present

## 2022-06-07 DIAGNOSIS — E785 Hyperlipidemia, unspecified: Secondary | ICD-10-CM | POA: Diagnosis not present

## 2022-06-07 DIAGNOSIS — R7989 Other specified abnormal findings of blood chemistry: Secondary | ICD-10-CM | POA: Diagnosis not present

## 2022-06-07 DIAGNOSIS — E559 Vitamin D deficiency, unspecified: Secondary | ICD-10-CM | POA: Diagnosis not present

## 2022-06-14 DIAGNOSIS — Z23 Encounter for immunization: Secondary | ICD-10-CM | POA: Diagnosis not present

## 2022-06-14 DIAGNOSIS — R82998 Other abnormal findings in urine: Secondary | ICD-10-CM | POA: Diagnosis not present

## 2022-06-14 DIAGNOSIS — Z1339 Encounter for screening examination for other mental health and behavioral disorders: Secondary | ICD-10-CM | POA: Diagnosis not present

## 2022-06-14 DIAGNOSIS — E669 Obesity, unspecified: Secondary | ICD-10-CM | POA: Diagnosis not present

## 2022-06-14 DIAGNOSIS — Z0000 Encounter for general adult medical examination without abnormal findings: Secondary | ICD-10-CM | POA: Diagnosis not present

## 2022-06-14 DIAGNOSIS — N1831 Chronic kidney disease, stage 3a: Secondary | ICD-10-CM | POA: Diagnosis not present

## 2022-06-14 DIAGNOSIS — F3341 Major depressive disorder, recurrent, in partial remission: Secondary | ICD-10-CM | POA: Diagnosis not present

## 2022-06-14 DIAGNOSIS — G3184 Mild cognitive impairment, so stated: Secondary | ICD-10-CM | POA: Diagnosis not present

## 2022-06-14 DIAGNOSIS — E785 Hyperlipidemia, unspecified: Secondary | ICD-10-CM | POA: Diagnosis not present

## 2022-06-14 DIAGNOSIS — R7301 Impaired fasting glucose: Secondary | ICD-10-CM | POA: Diagnosis not present

## 2022-06-14 DIAGNOSIS — E559 Vitamin D deficiency, unspecified: Secondary | ICD-10-CM | POA: Diagnosis not present

## 2022-06-14 DIAGNOSIS — Z1331 Encounter for screening for depression: Secondary | ICD-10-CM | POA: Diagnosis not present

## 2022-06-14 DIAGNOSIS — M8580 Other specified disorders of bone density and structure, unspecified site: Secondary | ICD-10-CM | POA: Diagnosis not present

## 2022-10-26 DIAGNOSIS — N390 Urinary tract infection, site not specified: Secondary | ICD-10-CM | POA: Diagnosis not present

## 2022-12-31 DIAGNOSIS — H9193 Unspecified hearing loss, bilateral: Secondary | ICD-10-CM | POA: Diagnosis not present

## 2022-12-31 DIAGNOSIS — H6123 Impacted cerumen, bilateral: Secondary | ICD-10-CM | POA: Diagnosis not present

## 2023-06-27 DIAGNOSIS — N1831 Chronic kidney disease, stage 3a: Secondary | ICD-10-CM | POA: Diagnosis not present

## 2023-06-27 DIAGNOSIS — R7989 Other specified abnormal findings of blood chemistry: Secondary | ICD-10-CM | POA: Diagnosis not present

## 2023-06-27 DIAGNOSIS — M8580 Other specified disorders of bone density and structure, unspecified site: Secondary | ICD-10-CM | POA: Diagnosis not present

## 2023-06-27 DIAGNOSIS — E559 Vitamin D deficiency, unspecified: Secondary | ICD-10-CM | POA: Diagnosis not present

## 2023-06-27 DIAGNOSIS — R7301 Impaired fasting glucose: Secondary | ICD-10-CM | POA: Diagnosis not present

## 2023-06-27 DIAGNOSIS — E785 Hyperlipidemia, unspecified: Secondary | ICD-10-CM | POA: Diagnosis not present

## 2023-06-28 DIAGNOSIS — R488 Other symbolic dysfunctions: Secondary | ICD-10-CM | POA: Diagnosis not present

## 2023-06-28 DIAGNOSIS — R41841 Cognitive communication deficit: Secondary | ICD-10-CM | POA: Diagnosis not present

## 2023-06-28 DIAGNOSIS — R4185 Anosognosia: Secondary | ICD-10-CM | POA: Diagnosis not present

## 2023-06-28 DIAGNOSIS — R4789 Other speech disturbances: Secondary | ICD-10-CM | POA: Diagnosis not present

## 2023-07-05 DIAGNOSIS — N1831 Chronic kidney disease, stage 3a: Secondary | ICD-10-CM | POA: Diagnosis not present

## 2023-07-05 DIAGNOSIS — R4185 Anosognosia: Secondary | ICD-10-CM | POA: Diagnosis not present

## 2023-07-05 DIAGNOSIS — F3341 Major depressive disorder, recurrent, in partial remission: Secondary | ICD-10-CM | POA: Diagnosis not present

## 2023-07-05 DIAGNOSIS — B379 Candidiasis, unspecified: Secondary | ICD-10-CM | POA: Diagnosis not present

## 2023-07-05 DIAGNOSIS — R488 Other symbolic dysfunctions: Secondary | ICD-10-CM | POA: Diagnosis not present

## 2023-07-05 DIAGNOSIS — R7301 Impaired fasting glucose: Secondary | ICD-10-CM | POA: Diagnosis not present

## 2023-07-05 DIAGNOSIS — R82998 Other abnormal findings in urine: Secondary | ICD-10-CM | POA: Diagnosis not present

## 2023-07-05 DIAGNOSIS — Z1339 Encounter for screening examination for other mental health and behavioral disorders: Secondary | ICD-10-CM | POA: Diagnosis not present

## 2023-07-05 DIAGNOSIS — F02A0 Dementia in other diseases classified elsewhere, mild, without behavioral disturbance, psychotic disturbance, mood disturbance, and anxiety: Secondary | ICD-10-CM | POA: Diagnosis not present

## 2023-07-05 DIAGNOSIS — G301 Alzheimer's disease with late onset: Secondary | ICD-10-CM | POA: Diagnosis not present

## 2023-07-05 DIAGNOSIS — R4789 Other speech disturbances: Secondary | ICD-10-CM | POA: Diagnosis not present

## 2023-07-05 DIAGNOSIS — Z1331 Encounter for screening for depression: Secondary | ICD-10-CM | POA: Diagnosis not present

## 2023-07-05 DIAGNOSIS — Z0000 Encounter for general adult medical examination without abnormal findings: Secondary | ICD-10-CM | POA: Diagnosis not present

## 2023-07-05 DIAGNOSIS — R41841 Cognitive communication deficit: Secondary | ICD-10-CM | POA: Diagnosis not present

## 2023-07-05 DIAGNOSIS — E785 Hyperlipidemia, unspecified: Secondary | ICD-10-CM | POA: Diagnosis not present

## 2023-08-10 DIAGNOSIS — N3000 Acute cystitis without hematuria: Secondary | ICD-10-CM | POA: Diagnosis not present

## 2023-10-13 DIAGNOSIS — R030 Elevated blood-pressure reading, without diagnosis of hypertension: Secondary | ICD-10-CM | POA: Diagnosis not present

## 2023-10-13 DIAGNOSIS — N390 Urinary tract infection, site not specified: Secondary | ICD-10-CM | POA: Diagnosis not present

## 2024-07-18 DIAGNOSIS — N390 Urinary tract infection, site not specified: Secondary | ICD-10-CM | POA: Diagnosis not present
